# Patient Record
Sex: Female | Born: 1990
Health system: Southern US, Community
[De-identification: ages and names within clinical notes are randomized; demographics above are authoritative.]

## PROBLEM LIST (undated history)

## (undated) ENCOUNTER — Inpatient Hospital Stay (HOSPITAL_COMMUNITY): Payer: Self-pay

## (undated) DIAGNOSIS — F17200 Nicotine dependence, unspecified, uncomplicated: Secondary | ICD-10-CM

## (undated) DIAGNOSIS — O09219 Supervision of pregnancy with history of pre-term labor, unspecified trimester: Secondary | ICD-10-CM

## (undated) DIAGNOSIS — Z8619 Personal history of other infectious and parasitic diseases: Secondary | ICD-10-CM

## (undated) DIAGNOSIS — Z87440 Personal history of urinary (tract) infections: Secondary | ICD-10-CM

## (undated) DIAGNOSIS — Z8669 Personal history of other diseases of the nervous system and sense organs: Secondary | ICD-10-CM

## (undated) DIAGNOSIS — S3141XA Laceration without foreign body of vagina and vulva, initial encounter: Secondary | ICD-10-CM

## (undated) DIAGNOSIS — Z9889 Other specified postprocedural states: Secondary | ICD-10-CM

## (undated) DIAGNOSIS — N760 Acute vaginitis: Secondary | ICD-10-CM

## (undated) DIAGNOSIS — B9689 Other specified bacterial agents as the cause of diseases classified elsewhere: Secondary | ICD-10-CM

## (undated) DIAGNOSIS — F1290 Cannabis use, unspecified, uncomplicated: Secondary | ICD-10-CM

## (undated) DIAGNOSIS — Z22330 Carrier of Group B streptococcus: Secondary | ICD-10-CM

## (undated) DIAGNOSIS — S3739XA Other injury of urethra, initial encounter: Secondary | ICD-10-CM

## (undated) DIAGNOSIS — R87629 Unspecified abnormal cytological findings in specimens from vagina: Secondary | ICD-10-CM

## (undated) DIAGNOSIS — N871 Moderate cervical dysplasia: Secondary | ICD-10-CM

## (undated) DIAGNOSIS — Z8742 Personal history of other diseases of the female genital tract: Secondary | ICD-10-CM

## (undated) DIAGNOSIS — IMO0002 Reserved for concepts with insufficient information to code with codable children: Secondary | ICD-10-CM

## (undated) DIAGNOSIS — O209 Hemorrhage in early pregnancy, unspecified: Secondary | ICD-10-CM

## (undated) HISTORY — PX: OTHER SURGICAL HISTORY: SHX169

## (undated) HISTORY — DX: Personal history of urinary (tract) infections: Z87.440

## (undated) HISTORY — PX: TONSILLECTOMY: SUR1361

## (undated) HISTORY — DX: Moderate cervical dysplasia: N87.1

## (undated) HISTORY — DX: Cannabis use, unspecified, uncomplicated: F12.90

## (undated) HISTORY — DX: Personal history of other infectious and parasitic diseases: Z86.19

## (undated) HISTORY — PX: WISDOM TOOTH EXTRACTION: SHX21

## (undated) HISTORY — DX: Acute vaginitis: N76.0

## (undated) HISTORY — DX: Nicotine dependence, unspecified, uncomplicated: F17.200

## (undated) HISTORY — DX: Reserved for concepts with insufficient information to code with codable children: IMO0002

## (undated) HISTORY — DX: Other specified bacterial agents as the cause of diseases classified elsewhere: B96.89

## (undated) HISTORY — DX: Other injury of urethra, initial encounter: S37.39XA

## (undated) HISTORY — DX: Personal history of other diseases of the female genital tract: Z87.42

## (undated) HISTORY — DX: Personal history of other diseases of the nervous system and sense organs: Z86.69

## (undated) HISTORY — PX: LEEP: SHX91

## (undated) HISTORY — DX: Laceration without foreign body of vagina and vulva, initial encounter: S31.41XA

## (undated) HISTORY — DX: Carrier of group B Streptococcus: Z22.330

## (undated) HISTORY — PX: DILATION AND CURETTAGE OF UTERUS: SHX78

## (undated) HISTORY — DX: Hemorrhage in early pregnancy, unspecified: O20.9

---
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
## 2009-03-14 ENCOUNTER — Inpatient Hospital Stay (HOSPITAL_COMMUNITY): Admission: AD | Admit: 2009-03-14 | Discharge: 2009-03-14 | Payer: Self-pay | Admitting: Obstetrics and Gynecology

## 2009-04-17 ENCOUNTER — Inpatient Hospital Stay (HOSPITAL_COMMUNITY): Admission: AD | Admit: 2009-04-17 | Discharge: 2009-04-17 | Payer: Self-pay | Admitting: Obstetrics and Gynecology

## 2009-04-18 ENCOUNTER — Inpatient Hospital Stay (HOSPITAL_COMMUNITY): Admission: AD | Admit: 2009-04-18 | Discharge: 2009-04-18 | Payer: Self-pay | Admitting: Obstetrics and Gynecology

## 2009-05-20 ENCOUNTER — Inpatient Hospital Stay (HOSPITAL_COMMUNITY): Admission: AD | Admit: 2009-05-20 | Discharge: 2009-05-20 | Payer: Self-pay | Admitting: Obstetrics and Gynecology

## 2009-06-07 ENCOUNTER — Inpatient Hospital Stay (HOSPITAL_COMMUNITY): Admission: AD | Admit: 2009-06-07 | Discharge: 2009-06-10 | Payer: Self-pay | Admitting: Obstetrics and Gynecology

## 2009-06-07 DIAGNOSIS — O209 Hemorrhage in early pregnancy, unspecified: Secondary | ICD-10-CM

## 2009-06-07 HISTORY — DX: Hemorrhage in early pregnancy, unspecified: O20.9

## 2009-07-04 DIAGNOSIS — S3733XA Laceration of urethra, initial encounter: Secondary | ICD-10-CM

## 2009-07-04 DIAGNOSIS — S3141XA Laceration without foreign body of vagina and vulva, initial encounter: Secondary | ICD-10-CM

## 2009-07-04 HISTORY — DX: Laceration of urethra, initial encounter: S37.33XA

## 2009-07-04 HISTORY — DX: Laceration without foreign body of vagina and vulva, initial encounter: S31.41XA

## 2009-12-18 ENCOUNTER — Emergency Department (HOSPITAL_COMMUNITY): Admission: EM | Admit: 2009-12-18 | Discharge: 2009-12-18 | Payer: Self-pay | Admitting: Emergency Medicine

## 2009-12-23 DIAGNOSIS — Z8742 Personal history of other diseases of the female genital tract: Secondary | ICD-10-CM

## 2009-12-23 HISTORY — DX: Personal history of other diseases of the female genital tract: Z87.42

## 2011-03-25 LAB — CBC
HCT: 38.9 % (ref 36.0–46.0)
Hemoglobin: 13.1 g/dL (ref 12.0–15.0)
MCHC: 33.6 g/dL (ref 30.0–36.0)
MCV: 89.4 fL (ref 78.0–100.0)
Platelets: 227 10*3/uL (ref 150–400)
RBC: 4.35 MIL/uL (ref 3.87–5.11)
RDW: 13.2 % (ref 11.5–15.5)
WBC: 9.3 10*3/uL (ref 4.0–10.5)

## 2011-03-25 LAB — DIFFERENTIAL
Basophils Absolute: 0 10*3/uL (ref 0.0–0.1)
Basophils Relative: 1 % (ref 0–1)
Eosinophils Absolute: 0 10*3/uL (ref 0.0–0.7)
Eosinophils Relative: 0 % (ref 0–5)
Lymphocytes Relative: 13 % (ref 12–46)
Lymphs Abs: 1.2 10*3/uL (ref 0.7–4.0)
Monocytes Absolute: 0.4 10*3/uL (ref 0.1–1.0)
Monocytes Relative: 4 % (ref 3–12)
Neutro Abs: 7.6 10*3/uL (ref 1.7–7.7)
Neutrophils Relative %: 82 % — ABNORMAL HIGH (ref 43–77)

## 2011-03-25 LAB — BASIC METABOLIC PANEL
BUN: 6 mg/dL (ref 6–23)
CO2: 22 meq/L (ref 19–32)
Calcium: 9.3 mg/dL (ref 8.4–10.5)
Chloride: 108 mEq/L (ref 96–112)
Creatinine, Ser: 0.66 mg/dL (ref 0.4–1.2)
GFR calc Af Amer: >60 mL/min (ref >60)
GFR calc non Af Amer: >60 mL/min (ref >60)
Glucose, Bld: 92 mg/dL (ref 70–99)
Potassium: 3.6 mEq/L (ref 3.5–5.1)
Sodium: 138 meq/L (ref 135–145)

## 2011-04-01 LAB — CBC
HCT: 31.5 % — ABNORMAL LOW (ref 36.0–46.0)
HCT: 34.7 % — ABNORMAL LOW (ref 36.0–46.0)
Hemoglobin: 11 g/dL — ABNORMAL LOW (ref 12.0–15.0)
Hemoglobin: 12.1 g/dL (ref 12.0–15.0)
MCHC: 33 g/dL (ref 30.0–36.0)
MCHC: 35.1 g/dL (ref 30.0–36.0)
MCV: 94.8 fL (ref 78.0–100.0)
MCV: 96.2 fL (ref 78.0–100.0)
Platelets: 185 10*3/uL (ref 150–400)
Platelets: 242 10*3/uL (ref 150–400)
RBC: 3.27 MIL/uL — ABNORMAL LOW (ref 3.87–5.11)
RBC: 3.66 MIL/uL — ABNORMAL LOW (ref 3.87–5.11)
RDW: 13.3 % (ref 11.5–15.5)
RDW: 13.4 % (ref 11.5–15.5)
WBC: 12.9 10*3/uL — ABNORMAL HIGH (ref 4.0–10.5)
WBC: 15.4 10*3/uL — ABNORMAL HIGH (ref 4.0–10.5)

## 2011-04-01 LAB — RPR: RPR Ser Ql: NONREACTIVE

## 2011-04-04 LAB — URINALYSIS, ROUTINE W REFLEX MICROSCOPIC
Bilirubin Urine: NEGATIVE
Glucose, UA: NEGATIVE mg/dL
Hgb urine dipstick: NEGATIVE
Ketones, ur: 15 mg/dL — AB
Nitrite: NEGATIVE
Protein, ur: NEGATIVE mg/dL
Specific Gravity, Urine: 1.025 (ref 1.005–1.030)
Urobilinogen, UA: 0.2 mg/dL (ref 0.0–1.0)
pH: 6.5 (ref 5.0–8.0)

## 2011-04-04 LAB — GC/CHLAMYDIA PROBE AMP, GENITAL
Chlamydia, DNA Probe: NEGATIVE
GC Probe Amp, Genital: NEGATIVE

## 2011-04-04 LAB — FETAL FIBRONECTIN: Fetal Fibronectin: NEGATIVE

## 2011-04-04 LAB — STREP B DNA PROBE: Strep Group B Ag: POSITIVE

## 2011-04-04 LAB — WET PREP, GENITAL
Clue Cells Wet Prep HPF POC: NONE SEEN
Trich, Wet Prep: NONE SEEN
Yeast Wet Prep HPF POC: NONE SEEN

## 2011-05-07 NOTE — H&P (Signed)
NAMESABRIEL, Wallace               ACCOUNT NO.:  [id number]   MEDICAL RECORD NO.:  [id number]          PATIENT TYPE:  INP   LOCATION:  9165                          FACILITY:  [facility]   PHYSICIAN:  Naima A. Dillard, M.D. DATE OF BIRTH:  [birth year]   DATE OF ADMISSION:  06/07/2009  DATE OF DISCHARGE:                              HISTORY & PHYSICAL   [name] is a 20 year old gravida 1 at 51.6 weeks' gestation who  presents after spontaneous rupture of membranes during her office today  with Dr. Estanislado Pandy today.  During that visit Dr. Estanislado Pandy had a 40-minute  conversation with her regarding her labile mood and depression as well  as her resistance to taking prescribed antidepressants or getting  therapy and counseling for her depression and anxiety.  Ms. Renaldo Reel  pregnancy is remarkable for:  1. History of LEEP in 2009.  2. Smoker.  3. First trimester bleeding.  4. Small right inguinal lymphadenopathy at new OB, currently resolved.  5. Depression, anxiety, panic attacks, palpitations, syncope.  6. Positive GBS.  7. Sciatica.  8. Recurrent UTIs this pregnancy.  9. Low BMI, total weight gain +26 pounds.   PERTINENT LABORATORIES:  [name]'s initial labs included a hemoglobin  of 12.6, hematocrit 36.4, platelets 196,000, blood type A+, antibody  screen negative, RPR nonreactive, rubella titer immune, hepatitis B  negative, HIV nonreactive.  A few months after her new OB she did have a  Pap smear that was negative and normal.  She had a first trimester  screen at 12-1/2 weeks that was normal.  She had a normal AFP.  She had  a 26-week Glucola that was normal at 86 mg/dL.  At that time her  hemoglobin was 10.6 and she had a negative RPR again.  She had a series  of fetal fibronectin tests that were done March 23, negative at 27.5  weeks, April 14 negative at 30.6 weeks and April 26, positive at 32.4  weeks.  Also at 27.5 weeks on March 23 she had a positive group B beta  strep test and  negative chlamydia and gonorrhea cultures.  Later at 88-  1/2 weeks she had a negative GBS test.  She had multiple urine cultures  for recurrent UTI and grew E. coli in her urine cultures.   CURRENT MEDICATIONS:  1. Include [place] vitamins only.  2. She is also on Zofran, last dose 2 days ago.  3. She states she never took the prescribed Lexapro or Zoloft.  4. She has had multiple courses of antibiotics including Macrobid,      Keflex, Septra. amoxicillin.  5. She also had two doses of betamethasone in April.   ALLERGIES:  She does not have any medication allergies, food allergies  or latex allergies.   HISTORY OF PRESENT PREGNANCY:  [name] entered prenatal care very early  with her interview at 9 weeks.  She did have significant nausea,  vomiting and anxiety at that time.  She was prescribed some Zofran.  She  was offered referral for counseling and declined.  She had a couple  visits to  the hospital and had at a 7 week ultrasound an anterior previa  was noted which later resolved.  Her right inguinal lymphadenopathy also  resolved.  She was put on some Keflex for that and then she was changed  to Septra. she began to have anxiety issues and episodes of tachycardia.  She grew E. coli in her urine culture and was put on some Macrobid for a  week at around 12 weeks.  At 12-1/2 weeks she had a first trimester  screen which was normal and began complaining of constipation and was  advised to begin MiraLax.  At that time it was noted that the previa had  resolved and the placenta was now anterior.  She did complete her Septra  course but had some ongoing symptoms of UTI.  She was recultured and E.  coli grew again.  She was put on liquid amoxicillin for 14 weeks.  At 16  weeks she had more panic attacks and an episode of syncope and she was  counseled regarding stress reduction and improved nutrition and  hydration.  Orthostatic blood pressures and pulses were done and were   normal.  She was also counseled on her smoking.  At 18 weeks she was  treated for a yeast infection with Diflucan.  At 19 weeks she had an  anatomy ultrasound which showed size concordant with dates, an anterior  placenta with a three-vessel cord, a normal AFI, cervix 3.18 cm long.  All of fetal anatomy was well visualized and found to be normal.  At 20  weeks she began to have back pain.  At 23 weeks she had a Pap and was  having insomnia and anxiety Dr. Estanislado Pandy prescribed some Zoloft.  The  patient states she never took it.  At 26 weeks she had her 1-hour  Glucola challenge which was normal at 86 mg/dL.  She had a hemoglobin of  10.6 at that time and a negative RPR and Dr. Pennie Rushing prescribed Lexapro.  The patient states she did not take it.  At 27.5 weeks she had a third  visit to the [facility]U for some cramping and mucus and that was the visit  where she had the  positive GBS culture and the first negative fetal  fibronectin.  She was given terbutaline for irritability of her uterus  at that time.  Her cervix was fingertip, 50% and -2 at the next visit at  28 weeks and the patient said she was using herbal  treatments for her  anxiety and panic attacks.  Two weeks later she had a followup fetal  fibronectin which was negative at 30 weeks and 6 days.  At 32 weeks and  6 days she was given another course of antibiotics for UTI and was  complaining about sciatica down her left leg.  She was also given some  more Zofran.  At 34 weeks and 4 days she had a positive fetal  fibronectin test.  At 37 weeks she was seen for decreased fetal movement  and was found to have a reactive NST.  At 39 weeks she was given some  ProctoFoam for hemorrhoid pain and today she was seen by Dr. Estanislado Pandy with  increasing depression and anxiety.  She had a spontaneous rupture of  membranes in the office and was sent over to the hospital.  However it  took her about 3 hours to get over here.  Dr. Estanislado Pandy did state that in   the 40 minute conversation she  had with the patient and her boyfriend  that she convinced her that she really needed to start on some  antidepression anxiety meds following delivery. The plan is for her to  have Zoloft.   MEDICAL HISTORY:  [name] began menstruation at 20 years of age with  cycles occurring every 28 days and lasting for 5 days.  Her  contraception use includes Yaz which she discontinued last fall.  She  does have a history of abnormal Pap and had a LEEP procedure in May of  2009.  She had the usual childhood vaccinations.  She states she did  have the chickenpox as a child.  Prior to the pregnancy she had only had  a one bladder infection but she has been treated three times during the  pregnancy.  She is a smoker since age 27.  She does have anxiety,  depression and panic attacks.  The patient did suffer a fractured elbow  at age 8.   SURGICAL HISTORY:  Only surgical history is the LEEP procedure in May of  2009.   FAMILY MEDICAL HISTORY:  She has a brother who is an insulin-dependent  diabetic.  She has a maternal aunt with migraines.  She has a maternal  grandfather with lung cancer.   GENETIC HISTORY:  The patient had a normal first trimester screen and  AFP.  Genetic screen for the patient and father of baby is  noncontributory except the father of the baby's sister and niece have  webbed feet.   SOCIAL HISTORY:  [name] is a single Caucasian teenager with a high  school degree.  No occupation is listed.  Boyfriend is stated Ulice Dash.  Also has a high school degree, works as a Production designer, theatre/television/film, also  Caucasian.  Stated religion is [place].  The patient does admit to use  of cigarettes throughout the pregnancy but denies use of alcohol or  street drugs.  There is a history of marijuana use 2 years ago.   PHYSICAL EXAM:  [name] is noticeably tense and tearful and repeatedly  states that she is certain she is going to pass out.  VITAL SIGNS:  110/72, pulse  99, respiratory rate 18.  Her axillary  temperature was 100.0 but she had just come in from outside where it is  nearly 90.  LUNGS:  Clear to auscultation bilaterally.  HEART:  Regular although slightly elevated rate without murmur.  She has  no edema of her upper extremities.  ABDOMEN:  Soft and gravid and appropriate for gestational age.  She has trace edema of bilateral lower extremities.  Normal DTRs,  negative Homans' sign x2.  CERVICAL EXAM:  Per Dr. Estanislado Pandy in the office was 4 cm, 90% effaced and  minus one.  Fetal heart rate is 130 baseline variability present,  reactive with multiple accelerations meeting the 15 x 15 criteria.  The  contraction pattern is very irregular with contractions occurring 1-6  minutes with inconsistent strength and duration.   IMPRESSION:  5. 20 year old gravida 1 at 39.6 weeks.  2. Spontaneous rupture of membranes.  3. Reassuring fetal heart rate.  4. Irregular contractions.  5. Positive GBS.  6. Increased anxiety and depression.   PLAN:  1. Admit to medical doctor services.  2. Begin penicillin per protocol.  3. Pitocin p.r.n. no progress in contraction pattern.      Janna Melsness, CNM      Naima A. Normand Sloop, M.D.  Electronically Signed    JM/MEDQ  D:  06/07/2009  T:  06/07/2009  Job:  [id number]

## 2011-05-07 NOTE — Op Note (Signed)
Tricia Wallace, Tricia Wallace               ACCOUNT NO.:  [id number]   MEDICAL RECORD NO.:  [id number]          PATIENT TYPE:  AMB   LOCATION:  SDC                           FACILITY:  [facility]   PHYSICIAN:  Dois Davenport A. Rivard, M.D. DATE OF BIRTH:  Nov 29, 1991   DATE OF PROCEDURE:  DATE OF DISCHARGE:                               OPERATIVE REPORT   PREOPERATIVE DIAGNOSIS:  Moderate cervical dysplasia or cervical  intraepithelial neoplasia II.   POSTOPERATIVE DIAGNOSIS:  Moderate cervical dysplasia or cervical  intraepithelial neoplasia II.   ANESTHESIA:  IV sedation, Raul Del, MD   PROCEDURE:  Loop electrical excision procedure or loop electrocautery  excision procedure.   SURGEON:  Esmeralda Arthur, MD   ASSISTANT:  None.   ESTIMATED BLOOD LOSS:  Minimal.   PROCEDURE:  After being informed of the planned procedure in presence of  her mother, as well as possible complications including bleeding,  infection, failure of treatment, cervical stenosis or cervical  incompetence, informed consent is obtained.  The patient is taken to OR  #3, given IV sedation, and placed in the lithotomy position.  A speculum  is inserted and her cervix was prepped with acetic acid.  Colposcopy  reveals the previously described lesion anteriorly with acetowhite  epithelium and posteriorly with acetowhite and sharp borders.  We  proceed with a paracervical block using 15 mL of lidocaine 1%,  epinephrine 1:200,000, and using a small loop due to the small size of  the cervix.  We do a LEEP in 2 passes removing the anterior lip and the  posterior lip of the cervix, so as to remain as shallow as possible.  The T zone is all included and part of the endocervical canal is  included due to a positive ECC on this patient.  At the end of the  procedure, the excision area is cauterized and Monsel was applied.  Hemostasis is adequate.  Instruments were removed.  Instruments and  sponge count is complete x2.  Estimated  blood loss is minimal.  The  procedure is return to the recovery room in a well and stable condition,  and discharged home.   SPECIMEN:  Anterior lip and posterior lip of the cervix sent to  pathology.      Crist Fat Rivard, M.D.  Electronically Signed     SAR/MEDQ  D:  05/13/2008  T:  05/14/2008  Job:  [id number]

## 2011-09-18 LAB — CBC
HCT: 39.5
Hemoglobin: 13.8
MCHC: 34.9
MCV: 89.6
Platelets: 223
RBC: 4.41
RDW: 12.8
WBC: 5.9

## 2011-09-18 LAB — PREGNANCY, URINE: Preg Test, Ur: NEGATIVE

## 2011-09-23 LAB — TYPE AND SCREEN
ABO/RH(D): A POS
Antibody Screen: NEGATIVE

## 2011-09-23 LAB — ABO/RH: ABO/RH(D): A POS

## 2011-10-07 LAB — URINE MICROSCOPIC-ADD ON: WBC, UA: NONE SEEN

## 2011-10-07 LAB — GC/CHLAMYDIA PROBE AMP, GENITAL
Chlamydia, DNA Probe: NEGATIVE
GC Probe Amp, Genital: NEGATIVE

## 2011-10-07 LAB — POCT PREGNANCY, URINE
Operator id: 223331
Operator id: 223331
Preg Test, Ur: NEGATIVE
Preg Test, Ur: NEGATIVE

## 2011-10-07 LAB — WET PREP, GENITAL
Clue Cells Wet Prep HPF POC: NONE SEEN
Trich, Wet Prep: NONE SEEN
Yeast Wet Prep HPF POC: NONE SEEN

## 2011-10-07 LAB — URINALYSIS, ROUTINE W REFLEX MICROSCOPIC
Bilirubin Urine: NEGATIVE
Glucose, UA: NEGATIVE
Ketones, ur: NEGATIVE
Leukocytes, UA: NEGATIVE
Nitrite: NEGATIVE
Protein, ur: NEGATIVE
Specific Gravity, Urine: 1.015
Urobilinogen, UA: 1
pH: 6.5

## 2012-07-16 ENCOUNTER — Encounter (HOSPITAL_COMMUNITY): Payer: Self-pay | Admitting: *Deleted

## 2012-07-16 ENCOUNTER — Inpatient Hospital Stay (HOSPITAL_COMMUNITY)
Admission: AD | Admit: 2012-07-16 | Discharge: 2012-07-16 | Disposition: A | Discharge status: Home / Self Care | Payer: Self-pay | Source: Ambulatory Visit | Attending: Obstetrics and Gynecology | Admitting: Obstetrics and Gynecology

## 2012-07-16 ENCOUNTER — Inpatient Hospital Stay (HOSPITAL_COMMUNITY): Payer: Self-pay

## 2012-07-16 DIAGNOSIS — R109 Unspecified abdominal pain: Secondary | ICD-10-CM | POA: Insufficient documentation

## 2012-07-16 DIAGNOSIS — Z331 Pregnant state, incidental: Secondary | ICD-10-CM

## 2012-07-16 HISTORY — DX: Other specified postprocedural states: Z98.890

## 2012-07-16 LAB — URINALYSIS, ROUTINE W REFLEX MICROSCOPIC
Bilirubin Urine: NEGATIVE
Glucose, UA: NEGATIVE mg/dL
Hgb urine dipstick: NEGATIVE
Ketones, ur: NEGATIVE mg/dL
Leukocytes, UA: NEGATIVE
Nitrite: NEGATIVE
Protein, ur: NEGATIVE mg/dL
Specific Gravity, Urine: <1.005 — ABNORMAL LOW (ref 1.005–1.030)
Urobilinogen, UA: 0.2 mg/dL (ref 0.0–1.0)
pH: 6.5 (ref 5.0–8.0)

## 2012-07-16 LAB — POCT PREGNANCY, URINE: Preg Test, Ur: POSITIVE — AB

## 2012-07-16 LAB — WET PREP, GENITAL
Clue Cells Wet Prep HPF POC: NONE SEEN
Trich, Wet Prep: NONE SEEN

## 2012-07-16 LAB — HCG, QUANTITATIVE, PREGNANCY: hCG, Beta Chain, Quant, S: 612 m[IU]/mL — ABNORMAL HIGH (ref <5)

## 2012-07-16 NOTE — MAU Note (Signed)
Patient states she has had 4 positive home pregnancy tests yesterday. Had been cramping for several days. No bleeding. Last Depo Provera was in September 2012.

## 2012-07-16 NOTE — Discharge Instructions (Signed)
Miscarriage (Spontaneous Miscarriage) A miscarriage is when you lose your baby before the twentieth week of pregnancy. Miscarriages happen in 15-20% of pregnancies. Most miscarriages happen in the first 13 weeks of the pregnancy. In medical terms, this is called a spontaneous miscarriage or early pregnancy loss. No further treatment is needed when the miscarriage is complete and all products of conception have been passed out of the body. You can begin trying for another pregnancy as soon as your caregiver says it is okay. CAUSES   Most causes are not known.   Genetic problems like abnormal, not enough or too many chromosomes.   Infection of the cervix or uterus.   An abnormal shaped uterus, fibroid tumors or congenital abnormalities.   Hormone problems.   Medical problems.   Incompetent cervix, the tissue in the cervix is not strong enough to hold the pregnancy.   Smoking, too much alcohol use and illegal drugs.   Trauma.  SYMPTOMS   Bleeding or spotting from the vagina.   Cramping of the lower abdomen.   Passing of fluid from the vagina with or without cramps or pain.   Passing fetal tissue.  TREATMENT   Sometimes no further treatment is necessary if you pass all the tissue in the uterus.   If partial parts of the fetus or placenta remain in the body (incomplete miscarriage), tissue left behind may become infected. Usually a D and C (Dilatation and Curettage) suction or scrapping of the uterus is necessary to remove the remaining tissue in uterus. The procedure is only done when your caregiver knows that there is no chance for the pregnancy to continue. This is determined by a physical exam, a negative pregnancy test, blood tests and perhaps an ultrasound revealing a dead fetus or no fetus developing because a problem occurred at conception (when the sperm and egg unite).   Medications may be necessary, antibiotics if there is an infection or medications to contract the uterus  if there is a lot of bleeding.   If you have Rh negative blood and your partner is Rh positive, you will need a Rho-gam shot (an immune globulin vaccine). This will protect your baby from having Rh blood problems in future pregnancies.  HOME CARE INSTRUCTIONS   Your caregiver may order bed rest (up to the bathroom only). He or she may allow you to continue light activity. You may need to make arrangements for the care of children and for any other responsibilities.   Keep track of the number of pads you use each day and how soaked (saturated) they are. Record this information.   Do not use tampons. Do not douche or have sexual intercourse until approved by your caregiver.   Only take over-the-counter or prescription medicines for pain, discomfort or fever as directed by your caregiver.   Do not take aspirin because it can cause bleeding.   It is very important to keep all follow-up appointments for re-evaluations and continuing management.   Tell your caregiver if you are experiencing domestic violence.   Women who have an Rh negative blood type (i.e., A, B, AB, or O negative) need to receive a drug called Rh(D) immune globulin (RhoGam). This medicine helps protect future fetuses against problems that can occur if an Rh negative mother is carrying a baby who is Rh positive.   If you and/or your partner are having problems with guilt or grieving, talk to your caregiver or seek counseling to help you cope with the pregnancy loss.   Allow enough time to grieve before trying to get pregnant again.  SEEK IMMEDIATE MEDICAL CARE IF:   You have severe cramps or pain in your stomach, back, or belly (abdomen).   You have a fever.   You pass large clots or tissue. Save any tissue for your caregiver to inspect.   Your bleeding increases.   You become light-headed, weak or have fainting episodes.   You develop chills.  Document Released: 06/04/2001 Document Revised: 11/28/2011 Document Reviewed:  07/11/2008 Exit[employer] Patient Information 2012 [employer] .Ectopic Pregnancy An ectopic pregnancy is when the fertilized egg attaches (implants) outside the uterus. Most ectopic pregnancies occur in the fallopian tube. Rarely do ectopic pregnancies occur on the ovary, intestine, pelvis, or cervix. An ectopic pregnancy does not have the ability to develop into a normal, healthy baby.  A ruptured ectopic pregnancy is one in which the fallopian tube gets torn or bursts and results in internal bleeding. Often there is intense abdominal pain, and sometimes, vaginal bleeding. Having an ectopic pregnancy can be a life-threatening experience. If left untreated, this dangerous condition can lead to a blood transfusion, abdominal surgery, or even death. CAUSES  Damage to the fallopian tubes is the suspected cause in most ectopic pregnancies.  RISK FACTORS Depending on your circumstances, the amount of risk of having an ectopic pregnancy will vary. There are 3 categories that may help you identify whether you are potentially at risk. High Risk  You have gone through infertility treatment.   You have had a previous ectopic pregnancy.   You have had previous tubal surgery.   You have had previous surgery to have the fallopian tubes tied (tubal ligation).   You have tubal problems or diseases.   You have been exposed to DES. DES is a medicine that was used until 1971 and had effects on babies whose mothers took the medicine.   You become pregnant while using an intrauterine device (IUD) for birth control.  Moderate Risk  You have a history of infertility.   You have a history of a sexually transmitted infection (STI).   You have a history of pelvic inflammatory disease (PID).   You have scarring from endometriosis.   You have multiple sexual partners.   You smoke.  Low Risk  You have had previous pelvic surgery.   You use vaginal douching.   You became sexually active before 22  years of age.  SYMPTOMS  An ectopic pregnancy should be suspected in anyone who has missed a period and has abdominal pain or bleeding.  You may experience normal pregnancy symptoms, such as:   Nausea.   Tiredness.   Breast tenderness.   Symptoms that are not normal include:   Pain with intercourse.   Irregular vaginal bleeding or spotting.   Cramping or pain on one side, or in the lower abdomen.   Fast heartbeat.   Passing out while having a bowel movement.   Symptoms of a ruptured ectopic pregnancy and internal bleeding may include:   Sudden, severe pain in the abdomen and pelvis.   Dizziness or fainting.   Pain in the shoulder area.  DIAGNOSIS  Tests that may be performed include:  A pregnancy test.   An ultrasound.   Testing the specific level of pregnancy hormone in the bloodstream.   Taking a sample of uterus tissue (dilation and curettage, D&C).   Surgery to perform a visual exam of the inside of the abdomen using a lighted tube (laparoscopy).  TREATMENT    An injection of methotrexate medicine may be given. This is given if:  The diagnosis is made early.   The fallopian tube has not ruptured.   You are considered to be a good candidate for the medicine.  Usually, pregnancy hormone blood levels are checked after methotrexate treatment. This is to be sure the medicine is effective. It may take 4 to 6 weeks for the pregnancy to be absorbed (though most pregnancies will be absorbed by 3 weeks). Surgical treatment may be needed. A lighted tube (laparoscope) is used to remove the tubal pregnancy. If severe internal bleeding occurs, a cut (incision) may be made in the lower abdomen (laparotomy), and the ectopic pregnancy is removed. This stops the bleeding. Part of the fallopian tube, or the whole tube, may be removed as well (salpingectomy). After surgery, pregnancy hormone tests may be done to be sure there is no pregnancy tissue left. You may receive a RhoGAM  shot if you are Rh negative and the father is Rh positive, or if you do not know the Rh type of the father. This is to prevent problems with any future pregnancy. SEEK IMMEDIATE MEDICAL CARE IF:  You have any symptoms of an ectopic pregnancy. This is a medical emergency. Document Released: 01/16/2005 Document Revised: 11/28/2011 Document Reviewed: 08/15/2011 Exit[employer] Patient Information 2012 [employer]. 

## 2012-07-16 NOTE — Progress Notes (Addendum)
History     Chief Complaint  Patient presents with  . Possible Pregnancy  . Abdominal Pain   @SFHPI @  OB History    Grav Para Term Preterm Abortions TAB SAB Ect Mult Living   2 1 1       1       Past Medical History  Diagnosis Date  . No pertinent past medical history     Past Surgical History  Procedure Date  . Wisdom tooth extraction   . Dilation and curettage of uterus     History reviewed. No pertinent family history.  History  Substance Use Topics  . Smoking status: Current Everyday Smoker -- 1.0 packs/day    Types: Cigarettes  . Smokeless tobacco: Not on file  . Alcohol Use: No    Allergies: No Known Allergies  No prescriptions prior to admission    @ROS @ Physical Exam   Blood pressure 107/70, pulse 83, temperature 98.5 F (36.9 C), temperature source Oral, resp. rate 16, height 5\' 5"  (1.651 m), weight 119 lb 3.2 oz (54.069 kg), last menstrual period 06/13/2012, SpO2 100.00%. History     Chief Complaint  Patient presents with  . Possible Pregnancy  . Abdominal Pain   @SFHPI @  OB History    Grav Para Term Preterm Abortions TAB SAB Ect Mult Living   2 1 1       1       Past Medical History  Diagnosis Date  . No pertinent past medical history     Past Surgical History  Procedure Date  . Wisdom tooth extraction   . Dilation and curettage of uterus     History reviewed. No pertinent family history.  History  Substance Use Topics  . Smoking status: Current Everyday Smoker -- 1.0 packs/day    Types: Cigarettes  . Smokeless tobacco: Not on file  . Alcohol Use: No    Allergies: No Known Allergies  No prescriptions prior to admission    @ROS @ Physical Exam   Blood pressure 107/70, pulse 83, temperature 98.5 F (36.9 C), temperature source Oral, resp. rate 16, height 5\' 5"  (1.651 m), weight 119 lb 3.2 oz (54.069 kg), last menstrual period 06/13/2012, SpO2 100.00%.  @PHYSEXAMBYAGE2 @  ED Course  Presents with c/o of "crampy abd  x 3-4 days, no BC since Sept depo provera, had a normal period 6/22 lasting 4 days, horrible odor when I pee" abdomen nontender, Normal hair distrubition mons pubis,  EGBUS WNL, sterile speculum exam,  vagina pink, moist normal rugae,  cerix LTC, with cervical motion tenderness, No adnexal masses, with L sided tenderness with pelvic exam White scant discharge WET PREP, GC/CHL Pelvic US pending. A [redacted]w[redacted]d P [place], labs pending report to S. Hetty Linhart, CNM  Lavera Guise, CNM   Addendum: Per S.Shanasia Ibrahim, CNM [place] results: Intrauterine gestational sac: A possible gestational sac is seen  centrally within the superior fundus  Yolk sac: Not visualized  Embryo: Not visualized  Cardiac Activity: Not visualized  MSD: 1.3 mm  Maternal uterus/adnexae:  Normal  QHCG = 612  C/W Dr Su Hilt  Pt to come back to [facility]U in 48hrs (Saturday) for repeat quant miscarriage and ectopic precautions  F/u US 1wk - based on quant, or earlier if pain increases or VB  Pt dc'd home in stable condition preg verification given

## 2012-07-17 ENCOUNTER — Other Ambulatory Visit: Payer: Self-pay | Admitting: Obstetrics and Gynecology

## 2012-07-17 LAB — GC/CHLAMYDIA PROBE AMP, GENITAL
Chlamydia, DNA Probe: NEGATIVE
GC Probe Amp, Genital: NEGATIVE

## 2012-07-18 ENCOUNTER — Inpatient Hospital Stay (HOSPITAL_COMMUNITY)
Admission: AD | Admit: 2012-07-18 | Discharge: 2012-07-18 | Disposition: A | Discharge status: Home / Self Care | Payer: Self-pay | Source: Ambulatory Visit | Attending: Obstetrics and Gynecology | Admitting: Obstetrics and Gynecology

## 2012-07-18 DIAGNOSIS — O99891 Other specified diseases and conditions complicating pregnancy: Secondary | ICD-10-CM | POA: Insufficient documentation

## 2012-07-18 LAB — HCG, QUANTITATIVE, PREGNANCY: hCG, Beta Chain, Quant, S: 1799 m[IU]/mL — ABNORMAL HIGH (ref <5)

## 2012-07-18 NOTE — MAU Note (Signed)
Tricia Wallace CNM notified of BHCG level 1799. Plan, may discharge pt to home

## 2012-07-18 NOTE — MAU Note (Signed)
Here for BHCG, no vaginal bleeding, no pain

## 2012-07-18 NOTE — MAU Provider Note (Signed)
  History     CSN: [id number]  Arrival date and time: 07/18/12 1837   None     Chief Complaint  Patient presents with  . Labs Only   HPI Pt presents for repeat quant hCg only.  Per RN, denies bleeding, cramping or abd pain.  Prev eval in MAU on 07/16/12  OB History    Grav Para Term Preterm Abortions TAB SAB Ect Mult Living   2 1 1       1       Past Medical History  Diagnosis Date  . No pertinent past medical history   . S/P LEEP     Past Surgical History  Procedure Date  . Wisdom tooth extraction   . Dilation and curettage of uterus     No family history on file.  History  Substance Use Topics  . Smoking status: Current Everyday Smoker -- 1.0 packs/day    Types: Cigarettes  . Smokeless tobacco: Not on file  . Alcohol Use: No    Allergies: No Known Allergies  No prescriptions prior to admission    ROS Physical Exam   Blood pressure 102/85, pulse 84, temperature 98.7 F (37.1 C), temperature source Oral, resp. rate 16, last menstrual period 06/13/2012.  Physical Exam Deferred  MAU Course  Procedures Results for orders placed during the hospital encounter of 07/18/12 (from the past 48 hour(s))  HCG, QUANTITATIVE, PREGNANCY     Status: Abnormal   Collection Time   07/18/12  6:51 PM      Component Value Range Comment   hCG, Beta Chain, Quant, S 1799 (*) <5 mIU/mL    07/16/12-quant 612    Assessment and Plan  Normally rising quant hCG  LM on pt VM to f/u as prev advised by Sanda Klein, CNM at [facility] this coming Thursday for [place] to confirm IUP.  Msg left with office for pt to be scheduled.   Revd ectopic/SAB s/s.  Tavien Chestnut O. 07/18/2012, 9:13 PM

## 2012-07-20 ENCOUNTER — Telehealth: Payer: Self-pay | Admitting: Obstetrics and Gynecology

## 2012-07-20 NOTE — Telephone Encounter (Signed)
Pt called, states was seen @ MAU by SL for pregnancy.  Had [facility] done, and rptd on Sat when pt saw NS.  Per NS note pt needed rpt [place] in the office, was aware that pt had no insurance but still wanted her to have an appt.  Per JF, pt can set up a payment plan until her insurance comes through.  Pt sched for viability [place] on Tuesday 07/28/12 @ 1530, f/u w/ EP @ 1600.  Transferred call to TF to discuss payment arrangements.

## 2012-07-20 NOTE — Telephone Encounter (Signed)
Triage/chart received

## 2012-07-27 ENCOUNTER — Other Ambulatory Visit: Payer: Self-pay

## 2012-07-27 DIAGNOSIS — O26849 Uterine size-date discrepancy, unspecified trimester: Secondary | ICD-10-CM

## 2012-07-27 DIAGNOSIS — N912 Amenorrhea, unspecified: Secondary | ICD-10-CM

## 2012-07-28 ENCOUNTER — Ambulatory Visit (INDEPENDENT_AMBULATORY_CARE_PROVIDER_SITE_OTHER): Payer: Self-pay

## 2012-07-28 ENCOUNTER — Other Ambulatory Visit: Payer: Self-pay | Admitting: Obstetrics and Gynecology

## 2012-07-28 ENCOUNTER — Encounter: Payer: Self-pay | Admitting: Obstetrics and Gynecology

## 2012-07-28 ENCOUNTER — Ambulatory Visit (INDEPENDENT_AMBULATORY_CARE_PROVIDER_SITE_OTHER): Payer: Self-pay | Admitting: Obstetrics and Gynecology

## 2012-07-28 VITALS — BP 90/62 | HR 68 | Wt 120.0 lb

## 2012-07-28 DIAGNOSIS — O3680X0 Pregnancy with inconclusive fetal viability, not applicable or unspecified: Secondary | ICD-10-CM

## 2012-07-28 DIAGNOSIS — O26849 Uterine size-date discrepancy, unspecified trimester: Secondary | ICD-10-CM

## 2012-07-28 DIAGNOSIS — O30009 Twin pregnancy, unspecified number of placenta and unspecified number of amniotic sacs, unspecified trimester: Secondary | ICD-10-CM

## 2012-07-28 LAB — US OB TRANSVAGINAL

## 2012-07-28 MED ORDER — ONDANSETRON HCL 8 MG PO TABS: 8.0000 mg | ORAL_TABLET | Freq: Three times a day (TID) | ORAL | Status: AC | PRN

## 2012-07-28 MED ORDER — ONDANSETRON HCL 8 MG PO TABS: 8.0000 mg | ORAL_TABLET | Freq: Three times a day (TID) | ORAL | Status: DC | PRN

## 2012-07-28 NOTE — Progress Notes (Signed)
21 YO patient seen at MAU at the end of July for pelvic pain and early pregnancy.  Presents today for a viability U/S.  GC and CT cultures done at that time were negative.  Patient denies any further pelvic pain but omplaining of tremendous nausea. States that in her previous pregnancy she spent most of her time vomiting and would like to have some Zofran on hand should her symptoms become unbearable.  O:  U/S-twin gestation:  A-5w 5d  with FHR= 157 bpm      and      B- 5w 5d with FHR=140 bpm      (DI/DI)  A: Twin Gestation: [redacted]w[redacted]d      Severe nausea      S/P LEEP Procedure (CIN-II)  P: Continue PNV       Zofran 8 mg #20 1 po q 8 hours prn-nausea  1 refill      Schedule NOB visit  Rennee Coyne, PA-C

## 2012-08-05 ENCOUNTER — Telehealth: Payer: Self-pay | Admitting: Obstetrics and Gynecology

## 2012-08-05 NOTE — Telephone Encounter (Signed)
Spoke with pt rgd msg pt states rx for zofran 200 dollars but can get zofran 8mg  disp number 10 at [facility] for 10 dollars wants to know if rx can be changed and sent to [facility] [place] advised pt will consult with EP and call her back pt voice understanding

## 2012-08-05 NOTE — Telephone Encounter (Signed)
TRIAGE/RX QUEST. °

## 2012-08-06 MED ORDER — ONDANSETRON 8 MG PO TBDP: 8.0000 mg | ORAL_TABLET | Freq: Three times a day (TID) | ORAL | Status: AC | PRN

## 2012-08-06 NOTE — Telephone Encounter (Signed)
Patient requesting Zofran 8 mg # 10 1 po q 8 hours (in lieu of previous Zofran prescription) due to cost.  Patient may have this prescription with 2 refills.  Nevae Pinnix, PA-C

## 2012-08-06 NOTE — Telephone Encounter (Signed)
Spoke with pt informed rx sent to pharm pt voice understanding 

## 2012-08-12 ENCOUNTER — Encounter: Payer: Self-pay | Admitting: Obstetrics and Gynecology

## 2012-08-12 ENCOUNTER — Telehealth: Payer: Self-pay | Admitting: Obstetrics and Gynecology

## 2012-08-12 NOTE — Telephone Encounter (Signed)
Lm for pt to cb. Calling to confirm mailing address & let her know letter requested was faxed to Ms. Williams today.

## 2012-08-28 ENCOUNTER — Other Ambulatory Visit: Payer: Self-pay

## 2012-08-28 ENCOUNTER — Ambulatory Visit (INDEPENDENT_AMBULATORY_CARE_PROVIDER_SITE_OTHER): Payer: Medicaid Other

## 2012-08-28 DIAGNOSIS — N949 Unspecified condition associated with female genital organs and menstrual cycle: Secondary | ICD-10-CM

## 2012-08-28 DIAGNOSIS — O26849 Uterine size-date discrepancy, unspecified trimester: Secondary | ICD-10-CM

## 2012-08-28 DIAGNOSIS — O26899 Other specified pregnancy related conditions, unspecified trimester: Secondary | ICD-10-CM

## 2012-08-28 DIAGNOSIS — O30009 Twin pregnancy, unspecified number of placenta and unspecified number of amniotic sacs, unspecified trimester: Secondary | ICD-10-CM

## 2012-08-28 DIAGNOSIS — Z331 Pregnant state, incidental: Secondary | ICD-10-CM

## 2012-08-28 DIAGNOSIS — R102 Pelvic and perineal pain: Secondary | ICD-10-CM

## 2012-08-28 DIAGNOSIS — Z36 Encounter for antenatal screening of mother: Secondary | ICD-10-CM

## 2012-08-28 LAB — POCT URINALYSIS DIPSTICK
Bilirubin, UA: NEGATIVE
Glucose, UA: NEGATIVE
Nitrite, UA: NEGATIVE
Spec Grav, UA: 1.02
Urobilinogen, UA: NEGATIVE
pH, UA: 5

## 2012-08-28 LAB — US OB COMP ADDL GEST LESS 14 WKS

## 2012-08-28 LAB — US OB COMP LESS 14 WKS

## 2012-08-29 LAB — PRENATAL PANEL VII
Antibody Screen: NEGATIVE
Basophils Absolute: 0 10*3/uL (ref 0.0–0.1)
Basophils Relative: 0 % (ref 0–1)
Eosinophils Absolute: 0.1 10*3/uL (ref 0.0–0.7)
Eosinophils Relative: 1 % (ref 0–5)
HCT: 32.7 % — ABNORMAL LOW (ref 36.0–46.0)
HIV: NONREACTIVE
Hemoglobin: 11.7 g/dL — ABNORMAL LOW (ref 12.0–15.0)
Hepatitis B Surface Ag: NEGATIVE
Lymphocytes Relative: 26 % (ref 12–46)
Lymphs Abs: 1.8 10*3/uL (ref 0.7–4.0)
MCH: 30.8 pg (ref 26.0–34.0)
MCHC: 35.8 g/dL (ref 30.0–36.0)
MCV: 86.1 fL (ref 78.0–100.0)
Monocytes Absolute: 0.4 10*3/uL (ref 0.1–1.0)
Monocytes Relative: 6 % (ref 3–12)
Neutro Abs: 4.8 10*3/uL (ref 1.7–7.7)
Neutrophils Relative %: 67 % (ref 43–77)
Platelets: 179 10*3/uL (ref 150–400)
RBC: 3.8 MIL/uL — ABNORMAL LOW (ref 3.87–5.11)
RDW: 13.1 % (ref 11.5–15.5)
Rh Type: POSITIVE
Rubella: 53.8 [IU]/mL — ABNORMAL HIGH
WBC: 7.1 10*3/uL (ref 4.0–10.5)

## 2012-08-30 LAB — CULTURE, OB URINE: Colony Count: 3000

## 2012-09-02 ENCOUNTER — Telehealth: Payer: Self-pay | Admitting: Obstetrics and Gynecology

## 2012-09-02 NOTE — Telephone Encounter (Signed)
Tc to pt regarding msg.  Lm on vm to call back. 

## 2012-09-02 NOTE — Telephone Encounter (Signed)
TRIAGE/TST RES QUES

## 2012-09-02 NOTE — Telephone Encounter (Signed)
Pt returned call. States at last visit was told there was blood in her urine and was awaiting her urine culture results.  Was worried about the blood being in her urine.  Pt denies any urinary sxs.  Pt informed urine culture insufficient growth of bacteria and not enough to treat, pt denies any vaginal bleeding.  Pt advised to push fluids, water and cranberry juice, and call back with any concerns.  Pt voices understanding and is more at ease.

## 2012-09-10 ENCOUNTER — Encounter (HOSPITAL_COMMUNITY): Payer: Self-pay | Admitting: Obstetrics and Gynecology

## 2012-09-10 ENCOUNTER — Encounter: Payer: Self-pay | Admitting: Obstetrics and Gynecology

## 2012-09-10 ENCOUNTER — Other Ambulatory Visit: Payer: Self-pay | Admitting: Obstetrics and Gynecology

## 2012-09-10 ENCOUNTER — Inpatient Hospital Stay (HOSPITAL_COMMUNITY)
Admission: AD | Admit: 2012-09-10 | Discharge: 2012-09-10 | Disposition: A | Discharge status: Home / Self Care | Payer: Medicaid Other | Source: Ambulatory Visit | Attending: Obstetrics and Gynecology | Admitting: Obstetrics and Gynecology

## 2012-09-10 ENCOUNTER — Ambulatory Visit (INDEPENDENT_AMBULATORY_CARE_PROVIDER_SITE_OTHER): Payer: Medicaid Other

## 2012-09-10 ENCOUNTER — Ambulatory Visit (INDEPENDENT_AMBULATORY_CARE_PROVIDER_SITE_OTHER): Payer: Medicaid Other | Admitting: Obstetrics and Gynecology

## 2012-09-10 VITALS — BP 102/60 | Wt 109.0 lb

## 2012-09-10 DIAGNOSIS — Z36 Encounter for antenatal screening of mother: Secondary | ICD-10-CM

## 2012-09-10 DIAGNOSIS — Z331 Pregnant state, incidental: Secondary | ICD-10-CM

## 2012-09-10 DIAGNOSIS — E860 Dehydration: Secondary | ICD-10-CM | POA: Insufficient documentation

## 2012-09-10 DIAGNOSIS — O30049 Twin pregnancy, dichorionic/diamniotic, unspecified trimester: Secondary | ICD-10-CM

## 2012-09-10 DIAGNOSIS — O4390 Unspecified placental disorder, unspecified trimester: Secondary | ICD-10-CM

## 2012-09-10 DIAGNOSIS — Z9889 Other specified postprocedural states: Secondary | ICD-10-CM

## 2012-09-10 DIAGNOSIS — O99891 Other specified diseases and conditions complicating pregnancy: Secondary | ICD-10-CM | POA: Insufficient documentation

## 2012-09-10 DIAGNOSIS — O3440 Maternal care for other abnormalities of cervix, unspecified trimester: Secondary | ICD-10-CM

## 2012-09-10 DIAGNOSIS — R112 Nausea with vomiting, unspecified: Secondary | ICD-10-CM

## 2012-09-10 DIAGNOSIS — O30009 Twin pregnancy, unspecified number of placenta and unspecified number of amniotic sacs, unspecified trimester: Secondary | ICD-10-CM

## 2012-09-10 LAB — POCT URINALYSIS DIPSTICK
Bilirubin, UA: NEGATIVE
Blood, UA: NEGATIVE
Glucose, UA: NEGATIVE
Leukocytes, UA: NEGATIVE
Nitrite, UA: NEGATIVE
Protein, UA: NEGATIVE
Spec Grav, UA: 1.01
Urobilinogen, UA: NEGATIVE
pH, UA: 5

## 2012-09-10 LAB — URINALYSIS, ROUTINE W REFLEX MICROSCOPIC
Bilirubin Urine: NEGATIVE
Glucose, UA: NEGATIVE mg/dL
Hgb urine dipstick: NEGATIVE
Ketones, ur: 15 mg/dL — AB
Leukocytes, UA: NEGATIVE
Nitrite: NEGATIVE
Protein, ur: 30 mg/dL — AB
Specific Gravity, Urine: 1.015 (ref 1.005–1.030)
Urobilinogen, UA: 4 mg/dL — ABNORMAL HIGH (ref 0.0–1.0)
pH: 8.5 — ABNORMAL HIGH (ref 5.0–8.0)

## 2012-09-10 LAB — US OB COMP ADDL GEST LESS 14 WKS

## 2012-09-10 LAB — URINE MICROSCOPIC-ADD ON

## 2012-09-10 LAB — US OB COMP LESS 14 WKS

## 2012-09-10 MED ORDER — ONDANSETRON 8 MG PO TBDP: 8.0000 mg | ORAL_TABLET | Freq: Three times a day (TID) | ORAL | Status: DC | PRN

## 2012-09-10 MED ORDER — ONDANSETRON HCL 4 MG/2ML IJ SOLN: 4.0000 mg | Freq: Once | INTRAMUSCULAR | Status: DC

## 2012-09-10 MED ORDER — LACTATED RINGERS IV BOLUS (SEPSIS): 1000.0000 mL | Freq: Once | INTRAVENOUS | Status: DC

## 2012-09-10 NOTE — Progress Notes (Signed)
[redacted]w[redacted]d Pt unable to void.  Pt c/o nausea

## 2012-09-10 NOTE — MAU Note (Signed)
Pt says she feels great and has been eating better in the last few days. Pt says she was surprised she was dehydrated because she feels so good.

## 2012-09-10 NOTE — Discharge Instructions (Signed)
Hyperemesis Gravidarum  Hyperemesis gravidarum is a severe form of nausea and vomiting that happens during pregnancy. Hyperemesis is worse than morning sickness. It may cause a woman to have nausea or vomiting all day for many days. It may keep a woman from eating and drinking enough food and liquids. Hyperemesis usually occurs during the first half (the first 20 weeks) of pregnancy. It often goes away once a woman is in her second half of pregnancy. However, sometimes hyperemesis continues through an entire pregnancy.   CAUSES   The cause of this condition is not completely known but is thought to be due to changes in the body's hormones when pregnant. It could be the high level of the pregnancy hormone or an increase in estrogen in the body.   SYMPTOMS    Severe nausea and vomiting.   Nausea that does not go away.   Vomiting that does not allow you to keep any food down.   Weight loss and body fluid loss (dehydration).   Having no desire to eat or not liking food you have previously enjoyed.  DIAGNOSIS   Your caregiver may ask you about your symptoms. Your caregiver may also order blood tests and urine tests to make sure something else is not causing the problem.   TREATMENT   You may only need medicine to control the problem. If medicines do not control the nausea and vomiting, you will be treated in the hospital to prevent dehydration, acidosis, weight loss, and changes in the electrolytes in your body that may harm the unborn baby (fetus). You may need intravenous (IV) fluids.   HOME CARE INSTRUCTIONS    Take all medicine as directed by your caregiver.   Try eating a couple of dry crackers or toast in the morning before getting out of bed.   Avoid foods and smells that upset your stomach.   Avoid fatty and spicy foods. Eat 5 to 6 small meals a day.   Do not drink when eating meals. Drink between meals.   For snacks, eat high protein foods, such as cheese. Eat or suck on things that have ginger in  them. Ginger helps nausea.   Avoid food preparation. The smell of food can spoil your appetite.   Avoid iron pills and iron in your multivitamins until after 3 to 4 months of being pregnant.  SEEK MEDICAL CARE IF:    Your abdominal pain increases since the last time you saw your caregiver.   You have a severe headache.   You develop vision problems.   You feel you are losing weight.  SEEK IMMEDIATE MEDICAL CARE IF:    You are unable to keep fluids down.   You vomit blood.   You have constant nausea and vomiting.   You have a fever.   You have excessive weakness, dizziness, fainting, or extreme thirst.  MAKE SURE YOU:    Understand these instructions.   Will watch your condition.   Will get help right away if you are not doing well or get worse.  Document Released: 12/09/2005 Document Revised: 11/28/2011 Document Reviewed: 03/11/2011  Exit[employer] Patient Information 2012 [employer].

## 2012-09-10 NOTE — MAU Note (Signed)
Pt presents to MAU with chief complaint of dehydration. Pt was sent from the Dr's office and says she was severely dehydrated and they sent her here for fluid.

## 2012-09-10 NOTE — Progress Notes (Signed)
Pt is a 21yo G2P1 at [redacted]w[redacted]d w twins She was seen in the office today and had [place] for 1st trimester screen and NOB w/u She states she was sent from the office for IVF's  S: pt reports mild nausea, no vomiting today, ate several bowls of vegetable soup for dinner Denies any pain, ctx, bleeding, she denies any dysuria  Pt desires to go home, she declines IVF's.   O:  Filed Vitals:   09/10/12 2055  BP: 102/59  Pulse: 77  Temp: 98.8 F (37.1 C)  TempSrc: Oral  Resp: 18    Results for orders placed during the hospital encounter of 09/10/12 (from the past 24 hour(s))  URINALYSIS, ROUTINE W REFLEX MICROSCOPIC     Status: Abnormal   Collection Time   09/10/12  8:38 PM      Component Value Range   Color, Urine YELLOW  YELLOW   APPearance CLEAR  CLEAR   Specific Gravity, Urine 1.015  1.005 - 1.030   pH 8.5 (*) 5.0 - 8.0   Glucose, UA NEGATIVE  NEGATIVE mg/dL   Hgb urine dipstick NEGATIVE  NEGATIVE   Bilirubin Urine NEGATIVE  NEGATIVE   Ketones, ur 15 (*) NEGATIVE mg/dL   Protein, ur 30 (*) NEGATIVE mg/dL   Urobilinogen, UA 4.0 (*) 0.0 - 1.0 mg/dL   Nitrite NEGATIVE  NEGATIVE   Leukocytes, UA NEGATIVE  NEGATIVE  URINE MICROSCOPIC-ADD ON     Status: Abnormal   Collection Time   09/10/12  8:38 PM      Component Value Range   Squamous Epithelial / LPF FEW (*) RARE   WBC, UA 3-6  <3 WBC/hpf   RBC / HPF 3-6  <3 RBC/hpf   Bacteria, UA MANY (*) RARE   Urine-Other AMORPHOUS URATES/PHOSPHATES     Gen: A&O x3 Abd: soft NT, gravid Pelvic - deferred  A: 12wk twin IUP Mild ketonuria and proteinuria  + bacteruria  Urine cx from 9-5 was negative  P: rv'd w pt UA results, and ketonuria likely from persistent nausea/vomiting, even though improving now She declines IVF's at this point She has agreed to continue to monitor and if still has vomiting tomorrow or over the weekend will come in for IVF's rv'd hyperemesis diet and continue on zofran PRN Pt dc'd home in stable condition

## 2012-09-12 ENCOUNTER — Encounter: Payer: Self-pay | Admitting: Obstetrics and Gynecology

## 2012-09-12 DIAGNOSIS — Z9889 Other specified postprocedural states: Secondary | ICD-10-CM | POA: Insufficient documentation

## 2012-09-12 DIAGNOSIS — O3440 Maternal care for other abnormalities of cervix, unspecified trimester: Secondary | ICD-10-CM | POA: Insufficient documentation

## 2012-09-12 LAB — URINE CULTURE: Colony Count: 9000

## 2012-09-12 NOTE — Progress Notes (Signed)
Tricia Wallace is a 21 y.o. female presenting for new ob visit. Certain of LMP, [place] 9/6 at 10 6/7 week agrees with edc. Denies srom, vag bleeding, with on going N, V 5-6 times a day, using zofran 4 mg bid abd stool softner, constipated with only 1 sm bm in past 1 week. @MED  @IPILAPH @ OB History    Grav Para Term Preterm Abortions TAB SAB Ect Mult Living   2 1 1       1      Past Medical History  Diagnosis Date  . S/P LEEP   . Smoker   . H/O varicella   . Abnormal Pap smear 04/2008    LEEP  . Hx: UTI (urinary tract infection)   . Migraine   . Inguinal lymphadenopathy     Right  . GBS carrier   . First trimester bleeding 06/07/2009  . H/O sciatica   . Marijuana use     History of use  . BV (bacterial vaginosis) 2009  . CIN II (cervical intraepithelial neoplasia II) 2009  . Anxiety 06/08/11  . Panic attacks   . Periurethral tear in female 07/04/09  . Labial tear 07/04/09  . H/O: menorrhagia 2011  . Depression 06/08/11    Patient resistance to taking prescribed  antidepressant or getting therapy/counseling - Dr. Normand Sloop   . Postpartum depression 2010   Past Surgical History  Procedure Date  . Wisdom tooth extraction   . Dilation and curettage of uterus    Family History: family history includes Cancer in her maternal grandfather and Diabetes in her brother. Social History:  reports that she has been smoking Cigarettes.  She has been smoking about .5 packs per day. She has never used smokeless tobacco. She reports that she does not drink alcohol or use illicit drugs.  @ROS @    Blood pressure 102/60, weight 109 lb (49.442 kg), last menstrual period 06/13/2012. Physical exam: Calm, no distress, HEENT wnl lungs clear bilaterally, breasts bilaterally no masses, dimpling,or drainage, AP RRR, abd soft, gravid, nt, bowel sounds active, abdomen nontenderNormal hair distrubition mons pubis,  EGBUS WNL,  cerix LTC, no cervical motion tenderness, No adnexal masses or tenderness No edema  to lower extremities Fundus 13 week size  Prenatal labs: ABO, Rh: A/POS/-- (09/06 1545) Antibody: NEG (09/06 1545) Rubella:  immune RPR: NON REAC (09/06 1545)  HBsAg: NEGATIVE (09/06 1545)  HIV: NON REACTIVE (09/06 1545)  GBS:   na  Assessment/Plan: 12 5/[redacted] week ga +2 ketones GC/CHL neg/neg WET PREP yeast txed PAP due in October next visit U;LTRASOUND today for1st trimester screen To MAU for IV hydration discussed, reviewed measures for constipation with enema, glycerin suppository for bm every few days, nutrition for N,V, use of zofran, Collaboration with Dr. Pennie Rushing. Tricia Wallace 09/12/2012, 1:24 PM Tricia Wallace, CNM

## 2012-10-07 ENCOUNTER — Ambulatory Visit: Payer: Self-pay | Admitting: Obstetrics and Gynecology

## 2012-10-08 ENCOUNTER — Observation Stay (HOSPITAL_COMMUNITY)
Admission: AD | Admit: 2012-10-08 | Discharge: 2012-10-09 | Disposition: A | Discharge status: Home / Self Care | Payer: Medicaid Other | Source: Ambulatory Visit | Attending: Obstetrics and Gynecology | Admitting: Obstetrics and Gynecology

## 2012-10-08 ENCOUNTER — Encounter (HOSPITAL_COMMUNITY): Payer: Self-pay | Admitting: Anesthesiology

## 2012-10-08 ENCOUNTER — Other Ambulatory Visit: Payer: Self-pay | Admitting: Obstetrics and Gynecology

## 2012-10-08 ENCOUNTER — Encounter (HOSPITAL_COMMUNITY): Payer: Self-pay | Admitting: *Deleted

## 2012-10-08 ENCOUNTER — Ambulatory Visit (INDEPENDENT_AMBULATORY_CARE_PROVIDER_SITE_OTHER): Payer: Medicaid Other | Admitting: Obstetrics and Gynecology

## 2012-10-08 VITALS — BP 100/62 | Wt 109.0 lb

## 2012-10-08 DIAGNOSIS — O218 Other vomiting complicating pregnancy: Secondary | ICD-10-CM

## 2012-10-08 DIAGNOSIS — R1115 Cyclical vomiting syndrome unrelated to migraine: Secondary | ICD-10-CM

## 2012-10-08 DIAGNOSIS — R824 Acetonuria: Secondary | ICD-10-CM

## 2012-10-08 DIAGNOSIS — Z23 Encounter for immunization: Secondary | ICD-10-CM

## 2012-10-08 DIAGNOSIS — O30009 Twin pregnancy, unspecified number of placenta and unspecified number of amniotic sacs, unspecified trimester: Secondary | ICD-10-CM

## 2012-10-08 DIAGNOSIS — O210 Mild hyperemesis gravidarum: Principal | ICD-10-CM | POA: Insufficient documentation

## 2012-10-08 DIAGNOSIS — E876 Hypokalemia: Secondary | ICD-10-CM | POA: Insufficient documentation

## 2012-10-08 DIAGNOSIS — O30049 Twin pregnancy, dichorionic/diamniotic, unspecified trimester: Secondary | ICD-10-CM | POA: Insufficient documentation

## 2012-10-08 DIAGNOSIS — O30002 Twin pregnancy, unspecified number of placenta and unspecified number of amniotic sacs, second trimester: Secondary | ICD-10-CM

## 2012-10-08 DIAGNOSIS — R1110 Vomiting, unspecified: Secondary | ICD-10-CM

## 2012-10-08 LAB — POCT URINALYSIS DIPSTICK
Spec Grav, UA: 1.02
Urobilinogen, UA: 1

## 2012-10-08 LAB — URINALYSIS, ROUTINE W REFLEX MICROSCOPIC
Bilirubin Urine: NEGATIVE
Glucose, UA: NEGATIVE mg/dL
Hgb urine dipstick: NEGATIVE
Ketones, ur: >80 mg/dL — AB
Nitrite: NEGATIVE
Protein, ur: NEGATIVE mg/dL
Specific Gravity, Urine: >1.03 — ABNORMAL HIGH (ref 1.005–1.030)
Urobilinogen, UA: 0.2 mg/dL (ref 0.0–1.0)
pH: 6 (ref 5.0–8.0)

## 2012-10-08 LAB — AMYLASE: Amylase: 57 U/L (ref 0–105)

## 2012-10-08 LAB — CBC WITH DIFFERENTIAL/PLATELET
Basophils Absolute: 0 10*3/uL (ref 0.0–0.1)
Basophils Relative: 0 % (ref 0–1)
Eosinophils Absolute: 0 10*3/uL (ref 0.0–0.7)
Eosinophils Relative: 1 % (ref 0–5)
HCT: 28.7 % — ABNORMAL LOW (ref 36.0–46.0)
Hemoglobin: 10.2 g/dL — ABNORMAL LOW (ref 12.0–15.0)
Lymphocytes Relative: 23 % (ref 12–46)
Lymphs Abs: 1.8 10*3/uL (ref 0.7–4.0)
MCH: 30.7 pg (ref 26.0–34.0)
MCHC: 35.5 g/dL (ref 30.0–36.0)
MCV: 86.4 fL (ref 78.0–100.0)
Monocytes Absolute: 0.4 10*3/uL (ref 0.1–1.0)
Monocytes Relative: 5 % (ref 3–12)
Neutro Abs: 5.6 10*3/uL (ref 1.7–7.7)
Neutrophils Relative %: 72 % (ref 43–77)
Platelets: 215 10*3/uL (ref 150–400)
RBC: 3.32 MIL/uL — ABNORMAL LOW (ref 3.87–5.11)
RDW: 12.6 % (ref 11.5–15.5)
WBC: 7.8 10*3/uL (ref 4.0–10.5)

## 2012-10-08 LAB — COMPREHENSIVE METABOLIC PANEL
ALT: 13 U/L (ref 0–35)
AST: 15 U/L (ref 0–37)
Albumin: 3 g/dL — ABNORMAL LOW (ref 3.5–5.2)
Alkaline Phosphatase: 51 U/L (ref 39–117)
BUN: 4 mg/dL — ABNORMAL LOW (ref 6–23)
CO2: 24 mEq/L (ref 19–32)
Calcium: 8.9 mg/dL (ref 8.4–10.5)
Chloride: 99 mEq/L (ref 96–112)
Creatinine, Ser: 0.38 mg/dL — ABNORMAL LOW (ref 0.50–1.10)
GFR calc Af Amer: >90 mL/min (ref >90)
GFR calc non Af Amer: >90 mL/min (ref >90)
Glucose, Bld: 99 mg/dL (ref 70–99)
Potassium: 3.1 meq/L — ABNORMAL LOW (ref 3.5–5.1)
Sodium: 134 meq/L — ABNORMAL LOW (ref 135–145)
Total Bilirubin: 0.2 mg/dL — ABNORMAL LOW (ref 0.3–1.2)
Total Protein: 5.7 g/dL — ABNORMAL LOW (ref 6.0–8.3)

## 2012-10-08 LAB — TSH: TSH: 0.015 u[IU]/mL — ABNORMAL LOW (ref 0.350–4.500)

## 2012-10-08 LAB — URINE MICROSCOPIC-ADD ON

## 2012-10-08 LAB — LIPASE, BLOOD: Lipase: 30 U/L (ref 11–59)

## 2012-10-08 MED ORDER — DEXTROSE IN LACTATED RINGERS 5 % IV SOLN
INTRAVENOUS | Status: DC
  Administered 2012-10-09 (×2): via INTRAVENOUS

## 2012-10-08 MED ORDER — PRENATAL MULTIVITAMIN CH
1.0000 | ORAL_TABLET | Freq: Every day | ORAL | Status: DC
Start: 2012-10-09 — End: 2012-10-09

## 2012-10-08 MED ORDER — METHYLPREDNISOLONE 16 MG PO TABS
16.0000 mg | ORAL_TABLET | Freq: Every day | ORAL | Status: DC
  Administered 2012-10-09: 16 mg via ORAL
  Filled 2012-10-08 (×2): qty 1

## 2012-10-08 MED ORDER — LACTATED RINGERS IV BOLUS (SEPSIS)
500.0000 mL | Freq: Once | INTRAVENOUS | Status: AC
  Administered 2012-10-08: 500 mL via INTRAVENOUS

## 2012-10-08 MED ORDER — PROMETHAZINE HCL 12.5 MG PO TABS: 12.5000 mg | ORAL_TABLET | Freq: Every day | ORAL | Status: DC

## 2012-10-08 MED ORDER — CALCIUM CARBONATE ANTACID 500 MG PO CHEW: 2.0000 | CHEWABLE_TABLET | ORAL | Status: DC | PRN

## 2012-10-08 MED ORDER — METHYLPREDNISOLONE SODIUM SUCC 125 MG IJ SOLR
48.0000 mg | Freq: Once | INTRAMUSCULAR | Status: AC
  Administered 2012-10-08: 48 mg via INTRAVENOUS
  Filled 2012-10-08: qty 0.77

## 2012-10-08 MED ORDER — PANTOPRAZOLE SODIUM 40 MG IV SOLR
40.0000 mg | Freq: Every day | INTRAVENOUS | Status: DC
  Administered 2012-10-08: 40 mg via INTRAVENOUS
  Filled 2012-10-08 (×2): qty 40

## 2012-10-08 MED ORDER — ZOLPIDEM TARTRATE 5 MG PO TABS: 5.0000 mg | ORAL_TABLET | Freq: Every evening | ORAL | Status: DC | PRN

## 2012-10-08 MED ORDER — METHYLPREDNISOLONE 4 MG PO TABS: 4.0000 mg | ORAL_TABLET | Freq: Every day | ORAL | Status: DC

## 2012-10-08 MED ORDER — LACTATED RINGERS IV SOLN: INTRAVENOUS | Status: DC

## 2012-10-08 MED ORDER — SCOPOLAMINE 1 MG/3DAYS TD PT72
1.0000 | MEDICATED_PATCH | TRANSDERMAL | Status: DC
  Administered 2012-10-08: 1.5 mg via TRANSDERMAL
  Filled 2012-10-08: qty 1

## 2012-10-08 MED ORDER — METHYLPREDNISOLONE 4 MG PO TABS: 8.0000 mg | ORAL_TABLET | Freq: Every day | ORAL | Status: DC

## 2012-10-08 MED ORDER — ONDANSETRON HCL 4 MG/2ML IJ SOLN: 4.0000 mg | Freq: Three times a day (TID) | INTRAMUSCULAR | Status: DC

## 2012-10-08 MED ORDER — ACETAMINOPHEN 325 MG PO TABS: 650.0000 mg | ORAL_TABLET | ORAL | Status: DC | PRN

## 2012-10-08 MED ORDER — ONDANSETRON HCL 8 MG PO TABS: 8.0000 mg | ORAL_TABLET | Freq: Three times a day (TID) | ORAL | Status: DC | PRN

## 2012-10-08 MED ORDER — DOCUSATE SODIUM 100 MG PO CAPS: 100.0000 mg | ORAL_CAPSULE | Freq: Every day | ORAL | Status: DC

## 2012-10-08 MED ORDER — METHYLPREDNISOLONE 16 MG PO TABS
16.0000 mg | ORAL_TABLET | Freq: Every day | ORAL | Status: DC
  Filled 2012-10-08: qty 1

## 2012-10-08 MED ORDER — ONDANSETRON 8 MG/NS 50 ML IVPB
8.0000 mg | Freq: Three times a day (TID) | INTRAVENOUS | Status: DC
  Administered 2012-10-08: 8 mg via INTRAVENOUS
  Filled 2012-10-08 (×4): qty 8

## 2012-10-08 NOTE — Progress Notes (Signed)
Pt stated no issues today. Pt stated last Sunday she passed out in [facility] . Pt stated she does not want the flu shot .

## 2012-10-08 NOTE — H&P (Signed)
Tricia Wallace is a 21 y.o. female, G2P1001P1 at [redacted]w[redacted]d,  presenting for severe Nausea and vomiting with Twin pregnancy. The patient has had Hyperemesis Gravidarum since 5 weeks of pregnancy.  Patient Active Problem List  Diagnosis  . Twin dichorionic diamniotic placenta  . History of cervical LEEP biopsy affecting care of mother, antepartum  . Flu vaccine need  . Hyperemesis complicating pregnancy, antepartum    History of present pregnancy: Patient entered care at 5 weeks Twin pregnancy Di/Di.  EDC of 03/20/13 was established by LMp and 5 weeks USS.  Last USS was at [redacted]w[redacted]d for Fetal Viability, with normal findings and an Di/Di placenta anterior.   Her prenatal course has been complicated by severe Hyperemesis since 5 weeks of Pregnancy. The patient has lost 11 lbs in weight in the past 10 weeks. At her last evalution,was in MAU prior to admission. Her condition was assessed and a consultation with Dr Stefano Gaul was made. There was a collaborative agreement to have the patient admitted for IV hydration, Anti-emetics and Steroid Taper.    OB History    Grav Para Term Preterm Abortions TAB SAB Ect Mult Living   2 1 1       1      Past Medical History  Diagnosis Date  . S/P LEEP   . Smoker   . H/O varicella   . Abnormal Pap smear 04/2008    LEEP  . Hx: UTI (urinary tract infection)   . GBS carrier   . First trimester bleeding 06/07/2009  . H/O sciatica   . Marijuana use     History of use  . BV (bacterial vaginosis) 2009  . CIN II (cervical intraepithelial neoplasia II) 2009  . Periurethral tear in female 07/04/09  . Labial tear 07/04/09  . H/O: menorrhagia 2011  . Inguinal lymphadenopathy     Right   Past Surgical History  Procedure Date  . Wisdom tooth extraction   . Dilation and curettage of uterus   . Leep    Family History: family history includes Cancer in her maternal grandfather and Diabetes in her brother. Social History:  reports that she has been smoking Cigarettes.   She has been smoking about .5 packs per day. She has never used smokeless tobacco. She reports that she does not drink alcohol or use illicit drugs.  ROS:  21 y/o Female Di/Di Twin Pregnancy with Hyperemesis Gravidarum.  No Known Allergies     Blood pressure 95/50, pulse 78, temperature 98.3 F (36.8 C), temperature source Oral, resp. rate 16, height 5\' 6"  (1.676 m), weight 110 lb (49.896 kg), last menstrual period 06/13/2012, SpO2 97.00%.  Chest clear Heart RRR without murmur Abd gravid, NT, FH to dates Pelvic: not done  FHR: Fht's A :135  and Fht's B: 145 UCs:  None.  Prenatal labs: ABO, Rh: A/POS/-- (09/06 1545) Antibody: NEG (09/06 1545) Rubella:   Immune RPR: NON REAC (09/06 1545)  HBsAg: NEGATIVE (09/06 1545)  HIV: NON REACTIVE (09/06 1545)  GBS:  unknown at this time but not present in urine early pregnancy Sickle cell/Hgb electrophoresis:  neg Pap:  Result unavailable  GC: neg Chlamydia:  neg Genetic screenings:  Not competed at this time Glucola:  Not completed as too early in pregnancy  Results for orders placed during the hospital encounter of 10/08/12 (from the past 24 hour(s))  URINALYSIS, ROUTINE W REFLEX MICROSCOPIC     Status: Abnormal   Collection Time   10/08/12  7:40 PM  Component Value Range   Color, Urine YELLOW  YELLOW   APPearance TURBID (*) CLEAR   Specific Gravity, Urine >1.030 (*) 1.005 - 1.030   pH 6.0  5.0 - 8.0   Glucose, UA NEGATIVE  NEGATIVE mg/dL   Hgb urine dipstick NEGATIVE  NEGATIVE   Bilirubin Urine NEGATIVE  NEGATIVE   Ketones, ur >80 (*) NEGATIVE mg/dL   Protein, ur NEGATIVE  NEGATIVE mg/dL   Urobilinogen, UA 0.2  0.0 - 1.0 mg/dL   Nitrite NEGATIVE  NEGATIVE   Leukocytes, UA SMALL (*) NEGATIVE  URINE MICROSCOPIC-ADD ON     Status: Abnormal   Collection Time   10/08/12  7:40 PM      Component Value Range   Squamous Epithelial / LPF MANY (*) RARE   WBC, UA 11-20  <3 WBC/hpf   Bacteria, UA MANY (*) RARE   Urine-Other  MUCOUS PRESENT    AMYLASE     Status: Normal   Collection Time   10/08/12  8:00 PM      Component Value Range   Amylase 57  0 - 105 U/L  COMPREHENSIVE METABOLIC PANEL     Status: Abnormal   Collection Time   10/08/12  8:00 PM      Component Value Range   Sodium 134 (*) 135 - 145 mEq/L   Potassium 3.1 (*) 3.5 - 5.1 mEq/L   Chloride 99  96 - 112 mEq/L   CO2 24  19 - 32 mEq/L   Glucose, Bld 99  70 - 99 mg/dL   BUN 4 (*) 6 - 23 mg/dL   Creatinine, Ser 1.61 (*) 0.50 - 1.10 mg/dL   Calcium 8.9  8.4 - 09.6 mg/dL   Total Protein 5.7 (*) 6.0 - 8.3 g/dL   Albumin 3.0 (*) 3.5 - 5.2 g/dL   AST 15  0 - 37 U/L   ALT 13  0 - 35 U/L   Alkaline Phosphatase 51  39 - 117 U/L   Total Bilirubin 0.2 (*) 0.3 - 1.2 mg/dL   GFR calc non Af Amer >90  >90 mL/min   GFR calc Af Amer >90  >90 mL/min  LIPASE, BLOOD     Status: Normal   Collection Time   10/08/12  8:00 PM      Component Value Range   Lipase 30  11 - 59 U/L  CBC WITH DIFFERENTIAL     Status: Abnormal   Collection Time   10/08/12  8:00 PM      Component Value Range   WBC 7.8  4.0 - 10.5 K/uL   RBC 3.32 (*) 3.87 - 5.11 MIL/uL   Hemoglobin 10.2 (*) 12.0 - 15.0 g/dL   HCT 04.5 (*) 40.9 - 81.1 %   MCV 86.4  78.0 - 100.0 fL   MCH 30.7  26.0 - 34.0 pg   MCHC 35.5  30.0 - 36.0 g/dL   RDW 91.4  78.2 - 95.6 %   Platelets 215  150 - 400 K/uL   Neutrophils Relative 72  43 - 77 %   Neutro Abs 5.6  1.7 - 7.7 K/uL   Lymphocytes Relative 23  12 - 46 %   Lymphs Abs 1.8  0.7 - 4.0 K/uL   Monocytes Relative 5  3 - 12 %   Monocytes Absolute 0.4  0.1 - 1.0 K/uL   Eosinophils Relative 1  0 - 5 %   Eosinophils Absolute 0.0  0.0 - 0.7 K/uL   Basophils Relative  0  0 - 1 %   Basophils Absolute 0.0  0.0 - 0.1 K/uL        Assessment/Plan: IUP at [redacted]w[redacted]d Di./Di Twin pregnancy with Hyperemesis Gravidarum and weight loss 11 lbs in past 10 years. The patient is anemic but at this stage of pregnancy due to Hyperemesis Gravidarum, is unable to tolerate PNV  po daily.   Plan: Admit for IV hydration, Anti-emetics, Taper Steroids and Lab Work surveillance. Dietician Consult.  Kenna Kirn, [place], [place] 10/08/2012, 10:32 PM

## 2012-10-08 NOTE — MAU Provider Note (Signed)
History    This patient is 21 y/o female at [redacted]w[redacted]d GA. G2P1001. Twin Pregnancy. Di/Di Twin Pregnancy and had had considerable weight loss due to Hyperemesis Gravidarum. Had been seen by Dr. Estanislado Pandy today at the office and has not been taking Zofran 8 mgs po as scheduled. Has had severe nausea and vomiting with this Twin pregnancy and has an appetite to eat but within 1 - 2 hrs all her food will be vomited back up. She describes herself as "Starving". The patient has had syncope attacks and had had 11 lbs weight loss to date. The patient has been admitted to MAU for IV hydration, Lab investigations, Anti- emetic Therapy and possible start of Tapered Steroid Course for this condition. The patient is aware that she may remain over night for stabilization of her condition.  CSN: [id number]  Arrival date and time: 10/08/12 1937   None     No chief complaint on file.  HPI  OB History    Grav Para Term Preterm Abortions TAB SAB Ect Mult Living   2 1 1       1       Past Medical History  Diagnosis Date  . S/P LEEP   . Smoker   . H/O varicella   . Abnormal Pap smear 04/2008    LEEP  . Hx: UTI (urinary tract infection)   . Migraine   . Inguinal lymphadenopathy     Right  . GBS carrier   . First trimester bleeding 06/07/2009  . H/O sciatica   . Marijuana use     History of use  . BV (bacterial vaginosis) 2009  . CIN II (cervical intraepithelial neoplasia II) 2009  . Anxiety 06/08/11  . Panic attacks   . Periurethral tear in female 07/04/09  . Labial tear 07/04/09  . H/O: menorrhagia 2011  . Depression 06/08/11    Patient resistance to taking prescribed  antidepressant or getting therapy/counseling - Dr. Normand Sloop   . Postpartum depression 2010    Past Surgical History  Procedure Date  . Wisdom tooth extraction   . Dilation and curettage of uterus   . Leep     Family History  Problem Relation Age of Onset  . Diabetes Brother   . Cancer Maternal Grandfather     Lung     History  Substance Use Topics  . Smoking status: Current Every Day Smoker -- 0.5 packs/day    Types: Cigarettes  . Smokeless tobacco: Never Used  . Alcohol Use: No    Allergies: No Known Allergies  Prescriptions prior to admission  Medication Sig Dispense Refill  . ondansetron (ZOFRAN) 8 MG tablet Take 1 tablet (8 mg total) by mouth every 8 (eight) hours as needed for nausea.  20 tablet  8  . ondansetron (ZOFRAN-ODT) 8 MG disintegrating tablet Take 1 tablet (8 mg total) by mouth every 8 (eight) hours as needed. For nausea  20 tablet  0  . promethazine (PHENERGAN) 12.5 MG tablet Take 1 tablet (12.5 mg total) by mouth at bedtime.  30 tablet  8    Review of Systems  Constitutional: Positive for weight loss.       Due to Hyperemesis Gravidarum with Twin Pregnancy.  HENT: Negative.   Eyes: Negative.   Respiratory: Negative.   Cardiovascular: Negative.   Gastrointestinal: Positive for nausea and vomiting.       Persistent Nausea and vomiting with this pregnancy  Genitourinary: Negative.   Musculoskeletal: Negative.   Skin: Negative.  Neurological: Positive for weakness.  Endo/Heme/Allergies: Negative.   Psychiatric/Behavioral: Positive for depression.       History of depression and panic attacks.   Physical Exam   Last menstrual period 06/13/2012.  Physical Exam  Constitutional: She is oriented to person, place, and time.       Under nourished and has had significant weight lass with this pregnancy due to Hyperemesis Gravidarum.  HENT:  Head: Normocephalic and atraumatic.  Eyes: Conjunctivae normal and EOM are normal. Pupils are equal, round, and reactive to light.  Neck: Normal range of motion. Neck supple.  Respiratory: Effort normal and breath sounds normal.  GI: Soft. Bowel sounds are normal.  Musculoskeletal: Normal range of motion.  Neurological: She is alert and oriented to person, place, and time. She has normal reflexes.  Skin: Skin is warm and dry.   Psychiatric: She has a normal mood and affect.    MAU Course  Procedures Labs: CBC with Diff          CMP          Amylase and Lipase          Pre- Albumin          TSH            U/A, microscopy          IV Fluids : LR /hr                  LR 250/ hr                  D5/ LR at 171ml/ hr   Maintenance while in MAU  IV Medications: Zofran 8 mgs  X 1 dose.                          Protonix 40mg s  X 1 dose  Transdermal: Scopolamine 1.5 mg patch to place behind ear.  Assessment and Plan  Consultation with Dr Stefano Gaul and he is in agreement for ms Nance to be admitted overnight for surveillance and to commence on tapered Steroid Course.  Larosa Rhines, CNM. 10/08/2012, 7:40 PM

## 2012-10-08 NOTE — MAU Note (Signed)
Patient is in with c/o n/v, no relief from zofran. She denies pain or vaginal bleeding. She is having twins. g2p1

## 2012-10-08 NOTE — Progress Notes (Signed)
[redacted]w[redacted]d Severe vomiting, not keeping any food and barely keeping fluids. Multiple near syncopal episodes. Lost 11 lbs so far. Chem 10: trace ketones To MAU for IV fluids and to start Prednisone taper. Pt informed that she may need to stay overnight.

## 2012-10-08 NOTE — MAU Note (Signed)
Was seen in doctors office today, has not been able to keep anything down

## 2012-10-08 NOTE — Progress Notes (Signed)
Pharmacy Consult:   MEDROL (METHYLPREDNISOLONE) TAPER  FOR HYPEREMESIS GRAVIDARUM PATIENTS  The following is a 14 day taper of methylprednisolone for hyperemesis. Doses on day 1  will be given IV.  All doses starting on day 2  will be given PO. (If patient cannot tolerate oral medications, contact the pharmacy to change route to IV.)   Date Day Morning Midday Bedtime  10/17 1   48mg   10/18 2 16  mg 16 mg 16 mg  10/19 3 16  mg 16 mg 16 mg  10/20 4 16  mg 8 mg 16 mg  10/21 5 16  mg 8 mg 8 mg  10/22 6 8  mg 8 mg 8 mg  10/23 7 8  mg 4 mg 8 mg  10/24 8 8  mg 4 mg 4 mg  10/25 9 8  mg 4 mg   10/26 10 8  mg 4 mg   10/27 11 8  mg    10/28 12 8  mg    10/29 13 4  mg    10/30 14 4  mg     Check fasting blood sugars daily while on the taper. Notify MD if fasting blood sugar>95.  Claybon Jabs 10/08/2012

## 2012-10-09 DIAGNOSIS — O218 Other vomiting complicating pregnancy: Secondary | ICD-10-CM

## 2012-10-09 DIAGNOSIS — O330 Maternal care for disproportion due to deformity of maternal pelvic bones: Secondary | ICD-10-CM

## 2012-10-09 LAB — GLUCOSE, CAPILLARY: Glucose-Capillary: 147 mg/dL — ABNORMAL HIGH (ref 70–99)

## 2012-10-09 LAB — PREALBUMIN: Prealbumin: 18.1 mg/dL (ref 17.0–34.0)

## 2012-10-09 MED ORDER — PANTOPRAZOLE SODIUM 40 MG PO TBEC: 40.0000 mg | DELAYED_RELEASE_TABLET | Freq: Every day | ORAL | Status: DC

## 2012-10-09 MED ORDER — MEDROL 8 MG PO TABS: 8.0000 mg | ORAL_TABLET | ORAL | Status: DC

## 2012-10-09 NOTE — Progress Notes (Signed)
History  Tricia Wallace is a 21 y.o. G2P1001 at [redacted]w[redacted]d   Chief Complaint  Patient presents with  . Emesis  States "feeling better I actually ate 2 meals, no nausea or vomiting, ready to go home". Has RX phenergan.   [redacted]w[redacted]d   Chief Complaint  Patient presents with  . Emesis   @SFHPI @  Prior to Admission medications   Medication Sig Start Date End Date Taking? Authorizing Provider  ondansetron (ZOFRAN-ODT) 8 MG disintegrating tablet Take 1 tablet (8 mg total) by mouth every 8 (eight) hours as needed. For nausea 09/10/12  Yes Malissa Hippo, CNM  promethazine (PHENERGAN) 12.5 MG tablet Take 1 tablet (12.5 mg total) by mouth at bedtime. 10/08/12   Esmeralda Arthur, MD    Patient Active Problem List  Diagnosis  . Twin dichorionic diamniotic placenta  . History of cervical LEEP biopsy affecting care of mother, antepartum  . Flu vaccine need  . Hyperemesis complicating pregnancy, antepartum   Vitals:  Blood pressure 88/45, pulse 70, temperature 99.3 F (37.4 C), temperature source Oral, resp. rate 16, height 5\' 6"  (1.676 m), weight 112 lb 4 oz (50.916 kg), last menstrual period 06/13/2012, SpO2 91.00%. OB History    Grav Para Term Preterm Abortions TAB SAB Ect Mult Living   2 1 1       1       Past Medical History  Diagnosis Date  . S/P LEEP   . Smoker   . H/O varicella   . Abnormal Pap smear 04/2008    LEEP  . Hx: UTI (urinary tract infection)   . GBS carrier   . First trimester bleeding 06/07/2009  . H/O sciatica   . Marijuana use     History of use  . BV (bacterial vaginosis) 2009  . CIN II (cervical intraepithelial neoplasia II) 2009  . Periurethral tear in female 07/04/09  . Labial tear 07/04/09  . H/O: menorrhagia 2011    Past Surgical History  Procedure Date  . Wisdom tooth extraction   . Dilation and curettage of uterus   . Leep     Family History  Problem Relation Age of Onset  . Diabetes Brother   . Cancer Maternal Grandfather     Lung    History    Substance Use Topics  . Smoking status: Current Every Day Smoker -- 0.5 packs/day    Types: Cigarettes  . Smokeless tobacco: Never Used  . Alcohol Use: No    Allergies: No Known Allergies  Prescriptions prior to admission  Medication Sig Dispense Refill  . ondansetron (ZOFRAN-ODT) 8 MG disintegrating tablet Take 1 tablet (8 mg total) by mouth every 8 (eight) hours as needed. For nausea  20 tablet  0  . promethazine (PHENERGAN) 12.5 MG tablet Take 1 tablet (12.5 mg total) by mouth at bedtime.  30 tablet  8    @ROS @ Physical Exam   Blood pressure 88/45, pulse 70, temperature 99.3 F (37.4 C), temperature source Oral, resp. rate 16, height 5\' 6"  (1.676 m), weight 112 lb 4 oz (50.916 kg), last menstrual period 06/13/2012, SpO2 91.00%.  @PHYSEXAMBYAGE2 @ Labs:  Recent Results (from the past 24 hour(s))  POCT URINALYSIS DIPSTICK   Collection Time   10/08/12  5:32 PM      Component Value Range   Color, UA       Clarity, UA       Glucose, UA T     Bilirubin, UA  Ketones, UA T     Spec Grav, UA 1.020     Blood, UA       pH, UA       Protein, UA       Urobilinogen, UA 1.0     Nitrite, UA       Leukocytes, UA Trace    URINALYSIS, ROUTINE W REFLEX MICROSCOPIC   Collection Time   10/08/12  7:40 PM      Component Value Range   Color, Urine YELLOW  YELLOW   APPearance TURBID (*) CLEAR   Specific Gravity, Urine >1.030 (*) 1.005 - 1.030   pH 6.0  5.0 - 8.0   Glucose, UA NEGATIVE  NEGATIVE mg/dL   Hgb urine dipstick NEGATIVE  NEGATIVE   Bilirubin Urine NEGATIVE  NEGATIVE   Ketones, ur >80 (*) NEGATIVE mg/dL   Protein, ur NEGATIVE  NEGATIVE mg/dL   Urobilinogen, UA 0.2  0.0 - 1.0 mg/dL   Nitrite NEGATIVE  NEGATIVE   Leukocytes, UA SMALL (*) NEGATIVE  URINE MICROSCOPIC-ADD ON   Collection Time   10/08/12  7:40 PM      Component Value Range   Squamous Epithelial / LPF MANY (*) RARE   WBC, UA 11-20  <3 WBC/hpf   Bacteria, UA MANY (*) RARE   Urine-Other MUCOUS PRESENT     AMYLASE   Collection Time   10/08/12  8:00 PM      Component Value Range   Amylase 57  0 - 105 U/L  COMPREHENSIVE METABOLIC PANEL   Collection Time   10/08/12  8:00 PM      Component Value Range   Sodium 134 (*) 135 - 145 mEq/L   Potassium 3.1 (*) 3.5 - 5.1 mEq/L   Chloride 99  96 - 112 mEq/L   CO2 24  19 - 32 mEq/L   Glucose, Bld 99  70 - 99 mg/dL   BUN 4 (*) 6 - 23 mg/dL   Creatinine, Ser 1.61 (*) 0.50 - 1.10 mg/dL   Calcium 8.9  8.4 - 09.6 mg/dL   Total Protein 5.7 (*) 6.0 - 8.3 g/dL   Albumin 3.0 (*) 3.5 - 5.2 g/dL   AST 15  0 - 37 U/L   ALT 13  0 - 35 U/L   Alkaline Phosphatase 51  39 - 117 U/L   Total Bilirubin 0.2 (*) 0.3 - 1.2 mg/dL   GFR calc non Af Amer >90  >90 mL/min   GFR calc Af Amer >90  >90 mL/min  LIPASE, BLOOD   Collection Time   10/08/12  8:00 PM      Component Value Range   Lipase 30  11 - 59 U/L  PREALBUMIN   Collection Time   10/08/12  8:00 PM      Component Value Range   Prealbumin 18.1  17.0 - 34.0 mg/dL  TSH   Collection Time   10/08/12  8:00 PM      Component Value Range   TSH 0.015 (*) 0.350 - 4.500 uIU/mL  CBC WITH DIFFERENTIAL   Collection Time   10/08/12  8:00 PM      Component Value Range   WBC 7.8  4.0 - 10.5 K/uL   RBC 3.32 (*) 3.87 - 5.11 MIL/uL   Hemoglobin 10.2 (*) 12.0 - 15.0 g/dL   HCT 04.5 (*) 40.9 - 81.1 %   MCV 86.4  78.0 - 100.0 fL   MCH 30.7  26.0 - 34.0 pg   MCHC  35.5  30.0 - 36.0 g/dL   RDW 16.1  09.6 - 04.5 %   Platelets 215  150 - 400 K/uL   Neutrophils Relative 72  43 - 77 %   Neutro Abs 5.6  1.7 - 7.7 K/uL   Lymphocytes Relative 23  12 - 46 %   Lymphs Abs 1.8  0.7 - 4.0 K/uL   Monocytes Relative 5  3 - 12 %   Monocytes Absolute 0.4  0.1 - 1.0 K/uL   Eosinophils Relative 1  0 - 5 %   Eosinophils Absolute 0.0  0.0 - 0.7 K/uL   Basophils Relative 0  0 - 1 %   Basophils Absolute 0.0  0.0 - 0.1 K/uL  GLUCOSE, CAPILLARY   Collection Time   10/09/12  5:54 AM      Component Value Range   Glucose-Capillary  147 (*) 70 - 99 mg/dL   Comment 1 Notify RN     ASSESSMENT: Patient Active Problem List  Diagnosis  . Twin dichorionic diamniotic placenta  . History of cervical LEEP biopsy affecting care of mother, antepartum  . Flu vaccine need  . Hyperemesis complicating pregnancy, antepartum   Physical Examination: Calm, no distress, cooperative, HEENT WNL grossly,  lungs clear bilaterally, AP RRR,  abd soft nt, Fhts x 2 present No edema to lower extremities -Homan's sign bilaterally  Intake/Output Summary (Last 24 hours) at 10/09/12 1341 Last data filed at 10/09/12 1300  Gross per 24 hour  Intake 2822.73 ml  Output    650 ml  Net 2172.73 ml  Total I/O In: 480 [P.O.:480] Out: 350 [Urine:350]  ED Course  Assessment/Plan Hyperemesis [redacted]W[redacted]D twin gestation hypokalemia scopalamine patch removed, discontinued now. Will update Dr. Pennie Rushing.  Lavera Guise, CNM

## 2012-10-09 NOTE — Discharge Summary (Signed)
Tricia Wallace is a 21 y.o. G2P1001 at [redacted]w[redacted]d presented hyperemesis  Admission diagnosis: ketonuria 80, hyperemesis, 16 week twin gestation  Discharge diagnosis: hyperemesis, 16 week twin gestation, N,V resolved  Treatment: IV hydration, zofran, medrol         States "feeling better I actually ate 2 meals, no nausea or vomiting, ready to go home". Has RX phenergan.   [redacted]w[redacted]d  Chief Complaint   Patient presents with   .  Emesis    @SFHPI @  Prior to Admission medications   Medication  Sig  Start Date  End Date  Taking?  Authorizing Provider   ondansetron (ZOFRAN-ODT) 8 MG disintegrating tablet  Take 1 tablet (8 mg total) by mouth every 8 (eight) hours as needed. For nausea  09/10/12   Yes  Malissa Hippo, CNM   promethazine (PHENERGAN) 12.5 MG tablet  Take 1 tablet (12.5 mg total) by mouth at bedtime.  10/08/12    Esmeralda Arthur, MD    Patient Active Problem List   Diagnosis   .  Twin dichorionic diamniotic placenta   .  History of cervical LEEP biopsy affecting care of mother, antepartum   .  Flu vaccine need   .  Hyperemesis complicating pregnancy, antepartum    Vitals: Blood pressure 88/45, pulse 70, temperature 99.3 F (37.4 C), temperature source Oral, resp. rate 16, height 5\' 6"  (1.676 m), weight 112 lb 4 oz (50.916 kg), last menstrual period 06/13/2012, SpO2 91.00%.  OB History    Grav  Para  Term  Preterm  Abortions  TAB  SAB  Ect  Mult  Living    2  1  1        1       Past Medical History   Diagnosis  Date   .  S/P LEEP    .  Smoker    .  H/O varicella    .  Abnormal Pap smear  04/2008     LEEP   .  Hx: UTI (urinary tract infection)    .  GBS carrier    .  First trimester bleeding  06/07/2009   .  H/O sciatica    .  Marijuana use      History of use   .  BV (bacterial vaginosis)  2009   .  CIN II (cervical intraepithelial neoplasia II)  2009   .  Periurethral tear in female  07/04/09   .  Labial tear  07/04/09   .  H/O: menorrhagia  2011    Past  Surgical History   Procedure  Date   .  Wisdom tooth extraction    .  Dilation and curettage of uterus    .  Leep     Family History   Problem  Relation  Age of Onset   .  Diabetes  Brother    .  Cancer  Maternal Grandfather       Lung    History   Substance Use Topics   .  Smoking status:  Current Every Day Smoker -- 0.5 packs/day     Types:  Cigarettes   .  Smokeless tobacco:  Never Used   .  Alcohol Use:  No    Allergies: No Known Allergies  Prescriptions prior to admission   Medication  Sig  Dispense  Refill   .  ondansetron (ZOFRAN-ODT) 8 MG disintegrating tablet  Take 1 tablet (8 mg total) by mouth every 8 (eight) hours as  needed. For nausea  20 tablet  0   .  promethazine (PHENERGAN) 12.5 MG tablet  Take 1 tablet (12.5 mg total) by mouth at bedtime.  30 tablet  8    @ROS @  Physical Exam   Blood pressure 88/45, pulse 70, temperature 99.3 F (37.4 C), temperature source Oral, resp. rate 16, height 5\' 6"  (1.676 m), weight 112 lb 4 oz (50.916 kg), last menstrual period 06/13/2012, SpO2 91.00%.  @PHYSEXAMBYAGE2 @  Labs:  Recent Results (from the past 24 hour(s))   POCT URINALYSIS DIPSTICK    Collection Time    10/08/12 5:32 PM   Component  Value  Range    Color, UA      Clarity, UA      Glucose, UA  T     Bilirubin, UA      Ketones, UA  T     Spec Grav, UA  1.020     Blood, UA      pH, UA      Protein, UA      Urobilinogen, UA  1.0     Nitrite, UA      Leukocytes, UA  Trace    URINALYSIS, ROUTINE W REFLEX MICROSCOPIC    Collection Time    10/08/12 7:40 PM   Component  Value  Range    Color, Urine  YELLOW  YELLOW    APPearance  TURBID (*)  CLEAR    Specific Gravity, Urine  >1.030 (*)  1.005 - 1.030    pH  6.0  5.0 - 8.0    Glucose, UA  NEGATIVE  NEGATIVE mg/dL    Hgb urine dipstick  NEGATIVE  NEGATIVE    Bilirubin Urine  NEGATIVE  NEGATIVE    Ketones, ur  >80 (*)  NEGATIVE mg/dL    Protein, ur  NEGATIVE  NEGATIVE mg/dL    Urobilinogen, UA  0.2  0.0 - 1.0  mg/dL    Nitrite  NEGATIVE  NEGATIVE    Leukocytes, UA  SMALL (*)  NEGATIVE   URINE MICROSCOPIC-ADD ON    Collection Time    10/08/12 7:40 PM   Component  Value  Range    Squamous Epithelial / LPF  MANY (*)  RARE    WBC, UA  11-20  <3 WBC/hpf    Bacteria, UA  MANY (*)  RARE    Urine-Other  MUCOUS PRESENT    AMYLASE    Collection Time    10/08/12 8:00 PM   Component  Value  Range    Amylase  57  0 - 105 U/L   COMPREHENSIVE METABOLIC PANEL    Collection Time    10/08/12 8:00 PM   Component  Value  Range    Sodium  134 (*)  135 - 145 mEq/L    Potassium  3.1 (*)  3.5 - 5.1 mEq/L    Chloride  99  96 - 112 mEq/L    CO2  24  19 - 32 mEq/L    Glucose, Bld  99  70 - 99 mg/dL    BUN  4 (*)  6 - 23 mg/dL    Creatinine, Ser  8.11 (*)  0.50 - 1.10 mg/dL    Calcium  8.9  8.4 - 10.5 mg/dL    Total Protein  5.7 (*)  6.0 - 8.3 g/dL    Albumin  3.0 (*)  3.5 - 5.2 g/dL    AST  15  0 - 37 U/L    ALT  13  0 - 35 U/L    Alkaline Phosphatase  51  39 - 117 U/L    Total Bilirubin  0.2 (*)  0.3 - 1.2 mg/dL    GFR calc non Af Amer  >90  >90 mL/min    GFR calc Af Amer  >90  >90 mL/min   LIPASE, BLOOD    Collection Time    10/08/12 8:00 PM   Component  Value  Range    Lipase  30  11 - 59 U/L   PREALBUMIN    Collection Time    10/08/12 8:00 PM   Component  Value  Range    Prealbumin  18.1  17.0 - 34.0 mg/dL   TSH    Collection Time    10/08/12 8:00 PM   Component  Value  Range    TSH  0.015 (*)  0.350 - 4.500 uIU/mL   CBC WITH DIFFERENTIAL    Collection Time    10/08/12 8:00 PM   Component  Value  Range    WBC  7.8  4.0 - 10.5 K/uL    RBC  3.32 (*)  3.87 - 5.11 MIL/uL    Hemoglobin  10.2 (*)  12.0 - 15.0 g/dL    HCT  40.9 (*)  81.1 - 46.0 %    MCV  86.4  78.0 - 100.0 fL    MCH  30.7  26.0 - 34.0 pg    MCHC  35.5  30.0 - 36.0 g/dL    RDW  91.4  78.2 - 95.6 %    Platelets  215  150 - 400 K/uL    Neutrophils Relative  72  43 - 77 %    Neutro Abs  5.6  1.7 - 7.7 K/uL    Lymphocytes  Relative  23  12 - 46 %    Lymphs Abs  1.8  0.7 - 4.0 K/uL    Monocytes Relative  5  3 - 12 %    Monocytes Absolute  0.4  0.1 - 1.0 K/uL    Eosinophils Relative  1  0 - 5 %    Eosinophils Absolute  0.0  0.0 - 0.7 K/uL    Basophils Relative  0  0 - 1 %    Basophils Absolute  0.0  0.0 - 0.1 K/uL   GLUCOSE, CAPILLARY    Collection Time    10/09/12 5:54 AM   Component  Value  Range    Glucose-Capillary  147 (*)  70 - 99 mg/dL    Comment 1  Notify RN      Physical Examination:  Calm, no distress, cooperative, HEENT WNL grossly,  lungs clear bilaterally, AP RRR,  abd soft nt, Fhts x 2 present  No edema to lower extremities  -Homan's sign bilaterally   Intake/Output Summary (Last 24 hours) at 10/09/12 1341 Last data filed at 10/09/12 1300   Gross per 24 hour   Intake  2822.73 ml   Output  650 ml   Net  2172.73 ml   Total I/O  In: 480 [P.O.:480]  Out: 350 [Urine:350]  ED Course   Assessment/Plan  Hyperemesis  [redacted]W[redacted]D twin gestation  hypokalemia  scopalamine patch removed, discontinued now. Discharge home, RX medrol discussed and hyperemesis gravidarum taper instruction sheet given to pt, continue zofran and phenergan discussed, orange juice or banana daily if unable to tolerate with check BMP next office visit, collaboration with Dr. Pennie Rushing per telephone.  Lavera Guise, CNM

## 2012-10-09 NOTE — Progress Notes (Signed)
INITIAL ADULT NUTRITION ASSESSMENT Date: 10/09/2012   Time: 12:18 PM INTERVENTION: Antenatal Regular diet,includes snacks TID Reinforced diet for hyperemesis  Reason for Assessment: Hyperemesis  ASSESSMENT: Female 21 y.o.  Dx: Patient Active Problem List  Diagnosis  . Twin dichorionic diamniotic placenta  . History of cervical LEEP biopsy affecting care of mother, antepartum  . Flu vaccine need  . Hyperemesis complicating pregnancy, antepartum    Hx:  Past Medical History  Diagnosis Date  . S/P LEEP   . Smoker   . H/O varicella   . Abnormal Pap smear 04/2008    LEEP  . Hx: UTI (urinary tract infection)   . GBS carrier   . First trimester bleeding 06/07/2009  . H/O sciatica   . Marijuana use     History of use  . BV (bacterial vaginosis) 2009  . CIN II (cervical intraepithelial neoplasia II) 2009  . Periurethral tear in female 07/04/09  . Labial tear 07/04/09  . H/O: menorrhagia 2011   Related Meds:     . docusate sodium  100 mg Oral Daily  . lactated ringers  500 mL Intravenous Once  . methylPREDNISolone  16 mg Oral Q breakfast   Followed by  . methylPREDNISolone  8 mg Oral Q breakfast   Followed by  . methylPREDNISolone  4 mg Oral Q breakfast  . methylPREDNISolone  16 mg Oral Q1400   Followed by  . methylPREDNISolone  8 mg Oral Q1400   Followed by  . methylPREDNISolone  4 mg Oral Q1400  . methylPREDNISolone  16 mg Oral QHS   Followed by  . methylPREDNISolone  8 mg Oral QHS   Followed by  . methylPREDNISolone  4 mg Oral QHS  . methylPREDNISolone (SOLU-MEDROL) injection  48 mg Intravenous Once  . ondansetron (ZOFRAN) IV  8 mg Intravenous Q8H  . pantoprazole (PROTONIX) IV  40 mg Intravenous QHS  . prenatal multivitamin  1 tablet Oral Daily  . scopolamine  1 patch Transdermal Q72H  . DISCONTD: ondansetron (ZOFRAN) IV  4 mg Intravenous Q8H    Ht: 5\' 6"  (167.6 cm)  Wt: 112 lb 4 oz (50.916 kg)  Ideal Wt: 59.3 kg% Ideal Wt: 93%  Usual Wt: 121 Lbs/55  kg % Usual Wt: 93%  Body mass index is 18.12 kg/(m^2). Prepregnancy BMI 19.6  Food/Nutrition Related Hx: Reports 11 week Hx of n/v. Vomits immediately after eating. As well as when stomach empty. Unable to tolerate any beverages except kool-ade and sprite. Foods with ginger not tolerated. Has been trying to consume small frequent meals. Labs:  CMP     Component Value Date/Time   NA 134* 10/08/2012 2000   K 3.1* 10/08/2012 2000   CL 99 10/08/2012 2000   CO2 24 10/08/2012 2000   GLUCOSE 99 10/08/2012 2000   BUN 4* 10/08/2012 2000   CREATININE 0.38* 10/08/2012 2000   CALCIUM 8.9 10/08/2012 2000   PROT 5.7* 10/08/2012 2000   ALBUMIN 3.0* 10/08/2012 2000   AST 15 10/08/2012 2000   ALT 13 10/08/2012 2000   ALKPHOS 51 10/08/2012 2000   BILITOT 0.2* 10/08/2012 2000   GFRNONAA >90 10/08/2012 2000   GFRAA >90 10/08/2012 2000   Intake:   Intake/Output Summary (Last 24 hours) at 10/09/12 1227 Last data filed at 10/09/12 0845  Gross per 24 hour  Intake 2342.73 ml  Output    500 ml  Net 1842.73 ml   Diet Order: Antenatal Regular.    Pt able to consume and keep down  portions of breakfast and lunch. Provided snack menu so she could order snacks TID.  Is aware of the significance of the weight loss. Has > 5 % loss of usual weight putting her and infants at nutritional risk, mild - moderate degree of malnutrition.  Understands that tube feedings are an option for the future, has no questions about tube feedings and at this time is not open to the idea. Wants to go home.   IVF:    dextrose 5% lactated ringers Last Rate: 125 mL/hr at 10/09/12 0911  lactated ringers     Estimated Nutritional Needs: Increased needs due to twin pregnancy. D5LR providing 510 Kcal/day   Kcal: 1900-2100 Protein: 70- 80 g Fluid: 2.3 L  NUTRITION DIAGNOSIS: Inadequate oral intake r/t hyperemesis aeb weight loss, n/v  MONITORING/EVALUATION(Goals): Improved po intake, allowing to meet 90 % estimated needs  twin pregnancy  EDUCATION NEEDS: -No education needs identified at this time  DOCUMENTATION CODES Per approved criteria  -Non-severe (moderate) malnutrition in the context of acute illness or injury  Per loss of 7% of usual weight in < 3 months and intake < 75% estimated needs  For > 7 days  Elisabeth Cara M.Odis Luster LDN Neonatal Nutrition Support Specialist Pager [phone]  10/09/2012, 12:18 PM

## 2012-10-09 NOTE — Progress Notes (Signed)
UR Chart review completed.  

## 2012-10-09 NOTE — Discharge Instructions (Signed)
Hyperemesis Gravidarum Hyperemesis gravidarum is a severe form of nausea and vomiting that happens during pregnancy. Hyperemesis is worse than morning sickness. It may cause a woman to have nausea or vomiting all day for many days. It may keep a woman from eating and drinking enough food and liquids. Hyperemesis usually occurs during the first half (the first 20 weeks) of pregnancy. It often goes away once a woman is in her second half of pregnancy. However, sometimes hyperemesis continues through an entire pregnancy.  CAUSES  The cause of this condition is not completely known but is thought to be due to changes in the body's hormones when pregnant. It could be the high level of the pregnancy hormone or an increase in estrogen in the body.  SYMPTOMS   Severe nausea and vomiting.  Nausea that does not go away.  Vomiting that does not allow you to keep any food down.  Weight loss and body fluid loss (dehydration).  Having no desire to eat or not liking food you have previously enjoyed. DIAGNOSIS  Your caregiver may ask you about your symptoms. Your caregiver may also order blood tests and urine tests to make sure something else is not causing the problem.  TREATMENT  You may only need medicine to control the problem. If medicines do not control the nausea and vomiting, you will be treated in the hospital to prevent dehydration, acidosis, weight loss, and changes in the electrolytes in your body that may harm the unborn baby (fetus). You may need intravenous (IV) fluids.  HOME CARE INSTRUCTIONS   Take all medicine as directed by your caregiver.  Try eating a couple of dry crackers or toast in the morning before getting out of bed.  Avoid foods and smells that upset your stomach.  Avoid fatty and spicy foods. Eat 5 to 6 small meals a day.  Do not drink when eating meals. Drink between meals.  For snacks, eat high protein foods, such as cheese. Eat or suck on things that have ginger in  them. Ginger helps nausea.  Avoid food preparation. The smell of food can spoil your appetite.  Avoid iron pills and iron in your multivitamins until after 3 to 4 months of being pregnant. SEEK MEDICAL CARE IF:   Your abdominal pain increases since the last time you saw your caregiver.  You have a severe headache.  You develop vision problems.  You feel you are losing weight. SEEK IMMEDIATE MEDICAL CARE IF:   You are unable to keep fluids down.  You vomit blood.  You have constant nausea and vomiting.  You have a fever.  You have excessive weakness, dizziness, fainting, or extreme thirst. MAKE SURE YOU:   Understand these instructions.  Will watch your condition.  Will get help right away if you are not doing well or get worse. Document Released: 12/09/2005 Document Revised: 03/02/2012 Document Reviewed: 03/11/2011 [employer] Patient Information 2013 [employer].  

## 2012-10-21 ENCOUNTER — Encounter: Payer: Self-pay | Admitting: Obstetrics and Gynecology

## 2012-10-21 ENCOUNTER — Other Ambulatory Visit: Payer: Self-pay | Admitting: Obstetrics and Gynecology

## 2012-10-21 ENCOUNTER — Ambulatory Visit (INDEPENDENT_AMBULATORY_CARE_PROVIDER_SITE_OTHER): Payer: Medicaid Other

## 2012-10-21 ENCOUNTER — Ambulatory Visit (INDEPENDENT_AMBULATORY_CARE_PROVIDER_SITE_OTHER): Payer: Medicaid Other | Admitting: Obstetrics and Gynecology

## 2012-10-21 VITALS — BP 98/62 | Wt 112.0 lb

## 2012-10-21 DIAGNOSIS — O30009 Twin pregnancy, unspecified number of placenta and unspecified number of amniotic sacs, unspecified trimester: Secondary | ICD-10-CM

## 2012-10-21 DIAGNOSIS — O30002 Twin pregnancy, unspecified number of placenta and unspecified number of amniotic sacs, second trimester: Secondary | ICD-10-CM

## 2012-10-21 DIAGNOSIS — Z3689 Encounter for other specified antenatal screening: Secondary | ICD-10-CM

## 2012-10-21 NOTE — Progress Notes (Signed)
[redacted]w[redacted]d  Ultrasound shows:  SIUP  S=D     [place] EDD: 09/20/2013            AFI: N/A           Cervical length: 2.64 cm           Placenta localization: [name] = Anterior 6.2 cm from internal OS, [name] = Anterior Placenta 6.8 cm from internal OS           Fetal presentation: [name] = vertex left, [name] = Breech maternal right                   Anatomy survey is normal           Gender : female

## 2012-10-21 NOTE — Progress Notes (Signed)
[redacted]w[redacted]d Ultrasound: twin pregnancy concordant with dates          Vertex/breech           Anatomy normal but cord insertion not well seen on Baby B Hyperemesis: just completed Medrol protocol and using Protonix, no vomiting in 2 weeks and 3 lbs weight gain

## 2012-10-22 LAB — US OB TRANSVAGINAL

## 2012-10-22 LAB — US OB COMP ADDL GEST + 14 WK

## 2012-11-18 ENCOUNTER — Ambulatory Visit (INDEPENDENT_AMBULATORY_CARE_PROVIDER_SITE_OTHER): Payer: Medicaid Other | Admitting: Obstetrics and Gynecology

## 2012-11-18 ENCOUNTER — Encounter: Payer: Self-pay | Admitting: Obstetrics and Gynecology

## 2012-11-18 VITALS — BP 98/50 | Wt 122.0 lb

## 2012-11-18 DIAGNOSIS — Z331 Pregnant state, incidental: Secondary | ICD-10-CM

## 2012-11-18 DIAGNOSIS — O3440 Maternal care for other abnormalities of cervix, unspecified trimester: Secondary | ICD-10-CM

## 2012-11-18 NOTE — Progress Notes (Signed)
[redacted]w[redacted]d Pap done today Col[facility] Daily Reccommended  Ultrasound at [facility] for cervical check

## 2012-11-18 NOTE — Progress Notes (Signed)
[redacted]w[redacted]d  Pt has no complaints

## 2012-11-23 LAB — PAP IG, CT-NG, RFX HPV ASCU
Chlamydia Probe Amp: NEGATIVE
GC Probe Amp: NEGATIVE

## 2012-12-02 ENCOUNTER — Other Ambulatory Visit: Payer: Self-pay | Admitting: Obstetrics and Gynecology

## 2012-12-02 ENCOUNTER — Encounter: Payer: Medicaid Other | Admitting: Obstetrics and Gynecology

## 2012-12-02 ENCOUNTER — Ambulatory Visit (INDEPENDENT_AMBULATORY_CARE_PROVIDER_SITE_OTHER): Payer: Medicaid Other | Admitting: Obstetrics and Gynecology

## 2012-12-02 ENCOUNTER — Ambulatory Visit (INDEPENDENT_AMBULATORY_CARE_PROVIDER_SITE_OTHER): Payer: Medicaid Other

## 2012-12-02 ENCOUNTER — Other Ambulatory Visit: Payer: Medicaid Other

## 2012-12-02 VITALS — BP 98/58 | Wt 126.0 lb

## 2012-12-02 DIAGNOSIS — O3440 Maternal care for other abnormalities of cervix, unspecified trimester: Secondary | ICD-10-CM

## 2012-12-02 DIAGNOSIS — Z9889 Other specified postprocedural states: Secondary | ICD-10-CM

## 2012-12-02 DIAGNOSIS — IMO0001 Reserved for inherently not codable concepts without codable children: Secondary | ICD-10-CM

## 2012-12-02 DIAGNOSIS — O30009 Twin pregnancy, unspecified number of placenta and unspecified number of amniotic sacs, unspecified trimester: Secondary | ICD-10-CM

## 2012-12-02 NOTE — Progress Notes (Signed)
[redacted]w[redacted]d Ultrasound: dichorionic and diamniotic Twins, baby A is breech, 1 lb. 8 oz. (32nd percentile), normal fluid.  Baby B is vertex, 1 lb. 7 oz. (23rd percentile), normal fluid.  Cervix is 2.08 cm and dynamic. Doing reasonably well.  Complains of swelling in her legs.  Exam within normal limits.  One plus edema.  Support stockings recommended. Patient was to return in 2 weeks. Repeat ultrasound next visit. Dr. Stefano Gaul

## 2012-12-02 NOTE — Progress Notes (Signed)
Pt stated no issues today.  

## 2012-12-04 LAB — US OB TRANSVAGINAL

## 2012-12-04 LAB — US OB FOLLOW UP

## 2012-12-24 ENCOUNTER — Ambulatory Visit (INDEPENDENT_AMBULATORY_CARE_PROVIDER_SITE_OTHER): Payer: Medicaid Other | Admitting: Obstetrics and Gynecology

## 2012-12-24 ENCOUNTER — Ambulatory Visit (INDEPENDENT_AMBULATORY_CARE_PROVIDER_SITE_OTHER): Payer: Medicaid Other

## 2012-12-24 VITALS — BP 92/54 | Wt 131.0 lb

## 2012-12-24 DIAGNOSIS — O26879 Cervical shortening, unspecified trimester: Secondary | ICD-10-CM

## 2012-12-24 DIAGNOSIS — O30009 Twin pregnancy, unspecified number of placenta and unspecified number of amniotic sacs, unspecified trimester: Secondary | ICD-10-CM

## 2012-12-24 DIAGNOSIS — Z331 Pregnant state, incidental: Secondary | ICD-10-CM

## 2012-12-24 DIAGNOSIS — O3440 Maternal care for other abnormalities of cervix, unspecified trimester: Secondary | ICD-10-CM

## 2012-12-24 DIAGNOSIS — IMO0001 Reserved for inherently not codable concepts without codable children: Secondary | ICD-10-CM

## 2012-12-24 LAB — FETAL FIBRONECTIN: Fetal Fibronectin: NEGATIVE

## 2012-12-24 NOTE — Progress Notes (Signed)
[redacted]w[redacted]d  pt desires cervix check.

## 2012-12-24 NOTE — Progress Notes (Signed)
[redacted]w[redacted]d Ultrasound: cervix 0.88 cm.  Dynamic. [name]: Frank breech, normal fluid. 2 pounds and 7 ounces (33rd percentile). [name]: Transverse, normal fluid. 2 pounds and 6 ounces (29th percentile). Preterm labor discussed.  Short cervix discussed.  The patient will begin progesterone vaginal suppositories 100 mg daily.  She will limit her activity. Gonorrhea, chlamydia, and beta strep, and fetal fibronectin obtained.  Cervix is closed, 50% effaced, and -3 by exam. The patient was offered hospitalization.  Betamethasone treatment was discussed. The patient will be called with her test results. The patient wants to have her tubes tied.  Sterilization was discussed. Returned office in one week if all of her testing remains normal. Repeat ultrasound next week to measure cervix. Dr. Stefano Gaul

## 2012-12-25 ENCOUNTER — Other Ambulatory Visit: Payer: Self-pay

## 2012-12-25 ENCOUNTER — Other Ambulatory Visit: Payer: Self-pay | Admitting: Obstetrics and Gynecology

## 2012-12-25 ENCOUNTER — Telehealth: Payer: Self-pay

## 2012-12-25 DIAGNOSIS — O3440 Maternal care for other abnormalities of cervix, unspecified trimester: Secondary | ICD-10-CM

## 2012-12-25 DIAGNOSIS — O26879 Cervical shortening, unspecified trimester: Secondary | ICD-10-CM

## 2012-12-25 LAB — US OB TRANSVAGINAL

## 2012-12-25 LAB — US OB FOLLOW UP

## 2012-12-25 LAB — GC/CHLAMYDIA PROBE AMP
CT Probe RNA: NEGATIVE
GC Probe RNA: NEGATIVE

## 2012-12-25 MED ORDER — PROGESTERONE 100 MG VA SUPP: 100.0000 mg | Freq: Once | VAGINAL | Status: DC

## 2012-12-25 NOTE — Telephone Encounter (Signed)
Tc to pt per negative FFN results. Pt voices understanding.  

## 2012-12-26 ENCOUNTER — Inpatient Hospital Stay (HOSPITAL_COMMUNITY)
Admission: AD | Admit: 2012-12-26 | Discharge: 2012-12-26 | Disposition: A | Discharge status: Home / Self Care | Payer: Medicaid Other | Source: Ambulatory Visit | Attending: Obstetrics and Gynecology | Admitting: Obstetrics and Gynecology

## 2012-12-26 DIAGNOSIS — O4700 False labor before 37 completed weeks of gestation, unspecified trimester: Secondary | ICD-10-CM | POA: Insufficient documentation

## 2012-12-26 DIAGNOSIS — O30009 Twin pregnancy, unspecified number of placenta and unspecified number of amniotic sacs, unspecified trimester: Secondary | ICD-10-CM | POA: Insufficient documentation

## 2012-12-26 LAB — STREP B DNA PROBE: GBSP: POSITIVE

## 2012-12-26 MED ORDER — BETAMETHASONE SOD PHOS & ACET 6 (3-3) MG/ML IJ SUSP
12.0000 mg | Freq: Once | INTRAMUSCULAR | Status: AC
  Administered 2012-12-26: 12 mg via INTRAMUSCULAR
  Filled 2012-12-26: qty 2

## 2012-12-27 ENCOUNTER — Inpatient Hospital Stay (HOSPITAL_COMMUNITY)
Admission: AD | Admit: 2012-12-27 | Discharge: 2012-12-27 | Disposition: A | Discharge status: Home / Self Care | Payer: Medicaid Other | Source: Ambulatory Visit | Attending: Obstetrics and Gynecology | Admitting: Obstetrics and Gynecology

## 2012-12-27 DIAGNOSIS — O4700 False labor before 37 completed weeks of gestation, unspecified trimester: Secondary | ICD-10-CM | POA: Insufficient documentation

## 2012-12-27 DIAGNOSIS — O26879 Cervical shortening, unspecified trimester: Secondary | ICD-10-CM

## 2012-12-27 DIAGNOSIS — O30009 Twin pregnancy, unspecified number of placenta and unspecified number of amniotic sacs, unspecified trimester: Secondary | ICD-10-CM | POA: Insufficient documentation

## 2012-12-27 MED ORDER — BETAMETHASONE SOD PHOS & ACET 6 (3-3) MG/ML IJ SUSP
12.0000 mg | Freq: Once | INTRAMUSCULAR | Status: AC
  Administered 2012-12-27: 12 mg via INTRAMUSCULAR
  Filled 2012-12-27: qty 2

## 2012-12-27 NOTE — MAU Note (Signed)
Pt here for 2nd betamethasone injection.

## 2012-12-31 ENCOUNTER — Ambulatory Visit (INDEPENDENT_AMBULATORY_CARE_PROVIDER_SITE_OTHER): Payer: Medicaid Other | Admitting: Obstetrics and Gynecology

## 2012-12-31 ENCOUNTER — Ambulatory Visit (INDEPENDENT_AMBULATORY_CARE_PROVIDER_SITE_OTHER): Payer: Medicaid Other

## 2012-12-31 ENCOUNTER — Telehealth: Payer: Self-pay | Admitting: Advanced Practice Midwife

## 2012-12-31 VITALS — BP 90/60 | Wt 129.0 lb

## 2012-12-31 DIAGNOSIS — IMO0001 Reserved for inherently not codable concepts without codable children: Secondary | ICD-10-CM

## 2012-12-31 DIAGNOSIS — O26879 Cervical shortening, unspecified trimester: Secondary | ICD-10-CM

## 2012-12-31 DIAGNOSIS — O30009 Twin pregnancy, unspecified number of placenta and unspecified number of amniotic sacs, unspecified trimester: Secondary | ICD-10-CM

## 2012-12-31 DIAGNOSIS — K649 Unspecified hemorrhoids: Secondary | ICD-10-CM

## 2012-12-31 MED ORDER — PROGESTERONE MICRONIZED 200 MG PO CAPS: ORAL_CAPSULE | ORAL | Status: DC

## 2012-12-31 MED ORDER — HYDROCORTISONE 2.5 % RE CREA: TOPICAL_CREAM | Freq: Two times a day (BID) | RECTAL | Status: DC

## 2012-12-31 NOTE — Progress Notes (Addendum)
[redacted]w[redacted]d Ultrasound: [name] breech, normal fluid. [name] transverse, normal fluid. Cervix 0.87 cm Patient reports contracting.  Contractions every 6 min. On NST with uterine irritability. Patient has received betamethasone. To the hospital for observation.  May need a fetal fibronectin. Dr. Stefano Gaul  Addendum:  Medicaid will not pay for progesterone vaginal suppositories.  Evidence supports progesterone replacement in twins with a short cervix.  We will prescribe Prometrium 200 mg tablets per vagina each day.  The patient complains of painful hemorrhoids.  We will prescribe Proctor cream HC to use as needed.  Dr. Stefano Gaul December 31, 2012 9:00 pm

## 2012-12-31 NOTE — Addendum Note (Signed)
Addended by: Janine Limbo on: 12/31/2012 09:05 PM   Modules accepted: Orders

## 2012-12-31 NOTE — Telephone Encounter (Signed)
Pt seen in office today. Dx short cervix, contracting. Instructed to come to MAU. Had not arrived as 2113. VM left asking pt to come to MAU.

## 2013-01-01 ENCOUNTER — Encounter (HOSPITAL_COMMUNITY): Payer: Self-pay

## 2013-01-01 ENCOUNTER — Inpatient Hospital Stay (HOSPITAL_COMMUNITY)
Admission: AD | Admit: 2013-01-01 | Discharge: 2013-01-12 | DRG: 781 | Disposition: A | Discharge status: Home / Self Care | Payer: Medicaid Other | Source: Ambulatory Visit | Attending: Obstetrics and Gynecology | Admitting: Obstetrics and Gynecology

## 2013-01-01 DIAGNOSIS — Z9889 Other specified postprocedural states: Secondary | ICD-10-CM | POA: Diagnosis present

## 2013-01-01 DIAGNOSIS — O4700 False labor before 37 completed weeks of gestation, unspecified trimester: Secondary | ICD-10-CM

## 2013-01-01 DIAGNOSIS — Z23 Encounter for immunization: Secondary | ICD-10-CM

## 2013-01-01 DIAGNOSIS — O228X9 Other venous complications in pregnancy, unspecified trimester: Secondary | ICD-10-CM | POA: Diagnosis present

## 2013-01-01 DIAGNOSIS — Z22330 Carrier of Group B streptococcus: Secondary | ICD-10-CM

## 2013-01-01 DIAGNOSIS — D649 Anemia, unspecified: Secondary | ICD-10-CM | POA: Diagnosis present

## 2013-01-01 DIAGNOSIS — O212 Late vomiting of pregnancy: Secondary | ICD-10-CM | POA: Diagnosis present

## 2013-01-01 DIAGNOSIS — O26879 Cervical shortening, unspecified trimester: Principal | ICD-10-CM | POA: Diagnosis present

## 2013-01-01 DIAGNOSIS — O30049 Twin pregnancy, dichorionic/diamniotic, unspecified trimester: Secondary | ICD-10-CM | POA: Diagnosis present

## 2013-01-01 DIAGNOSIS — O99019 Anemia complicating pregnancy, unspecified trimester: Secondary | ICD-10-CM | POA: Diagnosis present

## 2013-01-01 DIAGNOSIS — K649 Unspecified hemorrhoids: Secondary | ICD-10-CM

## 2013-01-01 DIAGNOSIS — O30009 Twin pregnancy, unspecified number of placenta and unspecified number of amniotic sacs, unspecified trimester: Secondary | ICD-10-CM

## 2013-01-01 DIAGNOSIS — O210 Mild hyperemesis gravidarum: Secondary | ICD-10-CM | POA: Diagnosis present

## 2013-01-01 DIAGNOSIS — O3090 Multiple gestation, unspecified, unspecified trimester: Secondary | ICD-10-CM | POA: Diagnosis present

## 2013-01-01 DIAGNOSIS — N883 Incompetence of cervix uteri: Secondary | ICD-10-CM

## 2013-01-01 DIAGNOSIS — IMO0001 Reserved for inherently not codable concepts without codable children: Secondary | ICD-10-CM

## 2013-01-01 DIAGNOSIS — O3440 Maternal care for other abnormalities of cervix, unspecified trimester: Secondary | ICD-10-CM | POA: Diagnosis present

## 2013-01-01 DIAGNOSIS — O09899 Supervision of other high risk pregnancies, unspecified trimester: Secondary | ICD-10-CM

## 2013-01-01 DIAGNOSIS — O99891 Other specified diseases and conditions complicating pregnancy: Secondary | ICD-10-CM | POA: Diagnosis present

## 2013-01-01 LAB — FETAL FIBRONECTIN: Fetal Fibronectin: POSITIVE — AB

## 2013-01-01 MED ORDER — CALCIUM CARBONATE ANTACID 500 MG PO CHEW: 2.0000 | CHEWABLE_TABLET | ORAL | Status: DC | PRN

## 2013-01-01 MED ORDER — ZOLPIDEM TARTRATE 5 MG PO TABS: 5.0000 mg | ORAL_TABLET | Freq: Every evening | ORAL | Status: DC | PRN

## 2013-01-01 MED ORDER — LIDOCAINE HCL 2 % EX GEL
Freq: Once | CUTANEOUS | Status: AC
  Administered 2013-01-01: 5 via TOPICAL

## 2013-01-01 MED ORDER — HYDROCORTISONE 2.5 % RE CREA
TOPICAL_CREAM | Freq: Two times a day (BID) | RECTAL | Status: DC
  Administered 2013-01-01 – 2013-01-12 (×12): via RECTAL
  Filled 2013-01-01: qty 28.35

## 2013-01-01 MED ORDER — DOCUSATE SODIUM 100 MG PO CAPS
100.0000 mg | ORAL_CAPSULE | Freq: Every day | ORAL | Status: DC
  Filled 2013-01-01 (×5): qty 1

## 2013-01-01 MED ORDER — PROGESTERONE MICRONIZED 200 MG PO CAPS
400.0000 mg | ORAL_CAPSULE | Freq: Every day | ORAL | Status: DC
  Administered 2013-01-01: 400 mg via VAGINAL
  Filled 2013-01-01: qty 2

## 2013-01-01 MED ORDER — PRENATAL MULTIVITAMIN CH
1.0000 | ORAL_TABLET | Freq: Every day | ORAL | Status: DC
  Administered 2013-01-04: 1 via ORAL
  Filled 2013-01-01 (×3): qty 1

## 2013-01-01 MED ORDER — ACETAMINOPHEN 325 MG PO TABS: 650.0000 mg | ORAL_TABLET | ORAL | Status: DC | PRN

## 2013-01-01 MED ORDER — ONDANSETRON 8 MG PO TBDP
8.0000 mg | ORAL_TABLET | Freq: Three times a day (TID) | ORAL | Status: DC | PRN
  Filled 2013-01-01: qty 1

## 2013-01-01 MED ORDER — NIFEDIPINE 10 MG PO CAPS
10.0000 mg | ORAL_CAPSULE | Freq: Four times a day (QID) | ORAL | Status: DC | PRN
  Administered 2013-01-05: 10 mg via ORAL
  Filled 2013-01-01 (×2): qty 1

## 2013-01-01 NOTE — Consult Note (Signed)
Maternity Admissions Unit History and Physical Exam for an Obstetrics Patient  [name] is a 22 y.o. female, G2P1001, at [redacted]w[redacted]d gestation, who presents for evaluation of preterm labor. She has been followed at the [facility] and Gynecology division of [facility].  Her pregnancy has been complicated by twins. Her cervix has been short as measured by ultrasound and exam. She had a negative fetal fibronectin one week ago. Her beta strep test was positive. Gonorrhea and Chlamydia were negative. Her cervix was noted to be 1 cm dilated one week ago. She has received 2 doses of betamethasone. She was seen in the office on 12/31/2012. She was noted to have contractions on the monitor. She was asked to come to the [facility] hospital [facility] for evaluation. She was concerned that she will lose her job she did not go to work. She presents today. She reports that her contractions are more frequent today than yesterday. The patient has a history of CIN-2. She has had a LEEP procedure. See history below.  An ultrasound showed that her twins are in a footling breech/transverse position.  The patient complains of painful hemorrhoids.  OB History    Grav Para Term Preterm Abortions TAB SAB Ect Mult Living   2 1 1       1       Past Medical History  Diagnosis Date  . S/P LEEP   . Smoker   . H/O varicella   . Abnormal Pap smear 04/2008    LEEP  . Hx: UTI (urinary tract infection)   . GBS carrier   . First trimester bleeding 06/07/2009  . H/O sciatica   . Marijuana use     History of use  . BV (bacterial vaginosis) 2009  . CIN II (cervical intraepithelial neoplasia II) 2009  . Periurethral tear in female 07/04/09  . Labial tear 07/04/09  . H/O: menorrhagia 2011    Prescriptions prior to admission  Medication Sig Dispense Refill  . ondansetron (ZOFRAN-ODT) 8 MG disintegrating tablet Take 1 tablet (8 mg total) by mouth every 8 (eight) hours as needed. For  nausea  20 tablet  0  . hydrocortisone (ANUSOL-HC) 2.5 % rectal cream Place rectally 2 (two) times daily.  30 g  3    Past Surgical History  Procedure Date  . Wisdom tooth extraction   . Dilation and curettage of uterus   . Leep     No Known Allergies  Family History: family history includes Cancer in her maternal grandfather and Diabetes in her brother.  Social History:  reports that she has been smoking Cigarettes.  She has been smoking about .5 packs per day. She has never used smokeless tobacco. She reports that she does not drink alcohol or use illicit drugs.  Review of systems: Normal pregnancy complaints.  Admission Physical Exam:  Dilation: 2.5 (2-3cm per dr Stefano Gaul) Effacement (%): 40 Exam by:: Dr Stefano Gaul Body mass index is 21.05 kg/(m^2).  Height 5\' 6"  (1.676 m), weight 130 lb 6.4 oz (59.149 kg), last menstrual period 06/13/2012.  HEENT:                 Within normal limits Chest:                   Clear Heart:                    Regular rate and rhythm Abdomen:  Gravid and nontender Extremities:          Grossly normal Neurologic exam: Grossly normal Pelvic exam:         Cervix: 3 cm, 40% effaced, ballotable  Prenatal labs: ABO, Rh:             A/POS/-- (09/06 1545) Antibody:              NEG (09/06 1545) Rubella:                  RPR:                    NON REAC (09/06 1545)  HBsAg:                 NEGATIVE (09/06 1545)  HIV:                       NON REACTIVE (09/06 1545)  GBS:                     POSITIVE (01/02 1715)  NST: Category 1x2; Contractions: Mild and infrequent .   Assessment:  [redacted]w[redacted]d gestation  Twin gestation  Footling breech/ transverse presentation  Preterm cervical shortening  Preterm cervical dilatation  Uterine contractions  Positive beta strep  Hemorrhoid pain  Cigarette smoker  Noncompliance  Plan:  Fetal fibronectin sent.  Management options were reviewed. The patient understands our concerns for  preterm delivery. She understands that a cesarean section will be required because of the presentation of her infants. She understands our concerns for rupture membranes with a dilated cervix. If the fetal fibronectin is negative, then I feel more comfortable discharging the patient home. She can take Procardia 10 mg every 2-4 hours for irregular contractions. I have encouraged her to spend more time resting.  If the fetal fibronectin is positive, then I recommend that she remain in the hospital for observation. Again, Procardia can be given for contractions.  Vaginal progesterone is recommended. Medicaid will not cover a compounded medication and the patient has not started her vaginal progesterone for that reason. We will try micronized progesterone (Prometrium 400 mg vaginally each day) (which Medicaid will pay for).  Lidocaine jelly to hemorrhoids.   Janine Limbo 01/01/2013, 8:33 PM

## 2013-01-01 NOTE — MAU Note (Signed)
Patient is in stating that she was asked to come to MAU to monitored for contractions. She states that she does not feel contractions, at times have tightening in her abdomen. She denies vaginal bleeding or lof. Reports good fetal movements.

## 2013-01-02 DIAGNOSIS — O4700 False labor before 37 completed weeks of gestation, unspecified trimester: Secondary | ICD-10-CM

## 2013-01-02 DIAGNOSIS — O30009 Twin pregnancy, unspecified number of placenta and unspecified number of amniotic sacs, unspecified trimester: Secondary | ICD-10-CM

## 2013-01-02 LAB — AMNISURE RUPTURE OF MEMBRANE (ROM) NOT AT ARMC: Amnisure ROM: NEGATIVE

## 2013-01-02 MED ORDER — PROGESTERONE MICRONIZED 200 MG PO CAPS
200.0000 mg | ORAL_CAPSULE | Freq: Every day | ORAL | Status: DC
  Administered 2013-01-02 – 2013-01-11 (×10): 200 mg via VAGINAL
  Filled 2013-01-02 (×10): qty 1

## 2013-01-02 NOTE — Progress Notes (Addendum)
Patient ID: [name], female   DOB: 09/20/1991, 22 y.o.   MRN: [id number]  Hospital day # 1 pregnancy at [redacted]w[redacted]d  S:  States she wants to go home.  States she does not do well with decreased activity and closed quarters - "I just need to breath some fresh air out of this room".  Denies chest pain, SOB, abd pain, leg pain       well, reports good fetal activity      Contractions:none-denies at present.      Vaginal bleeding:None       Vaginal discharge: watery-states she has had 2 gushes of large amt of clear watery discharge from vagina when up to BR and placing anusol suppository.  1st occurred at approx 0800 today and again "few hrs later".    O: BP 102/60  Pulse 97  Temp 98.3 F (36.8 C) (Oral)  Resp 20  Ht 5\' 6"  (1.676 m)  Wt 130 lb 6.4 oz (59.149 kg)  BMI 21.05 kg/m2  LMP 06/13/2012       Filed Vitals:   01/02/13 0355 01/02/13 0800 01/02/13 0819 01/02/13 1155  BP: 105/48  97/50 102/60  Pulse: 87  83 97  Temp: 98.5 F (36.9 C)  98.2 F (36.8 C) 98.3 F (36.8 C)  TempSrc: Oral  Oral Oral  Resp: 20 20 18 20   Height:      Weight:           Heart:  RRR      Lungs:  CTA bilat      Abd:  Soft, pos BS x 4 quads.       Fetal tracings:reviewed and reassuring [name] baseline 130bpm with mod variability and accels present, no decels, Cat 1; [name] baseline 140bpm with mod variability and accels present, no decels, Cat 1.        Toco: Uterine irritability with rare contraction traced since admission      Uterus gravid, soft and non-tender      Extremities: Neg edema, neg Homans bilat.  No evidence of DVT bilat.      Perineum: Dry without evidence of gross ROM.  SVE deferred at present.  A: [redacted]w[redacted]d with Twins/Positive FFN/Shortened Cx      [name] footling breech presentation     ? SROM  P: Amnisure collected and will await results.     Will update Dr. Pennie Rushing.      Pt desires wheelchair ride outside.    SMITH,NONA O., CNM 01/02/2013 1:56 PM      1)Amnisure is  negative 2)I had a long discussion with the patient concerning her high risk for preterm delivery with the following features: Twin gestation.                preterm cervical dilatation                history of LEEP                cigarette smoking                positive fetal fibronectin 3)I have discussed the risk factors associated with a footling breech presentation, which include the risk of preterm premature rupture of membranes with cord prolapse and how the outcome would be suboptimal if the patient had this event outside the hospital. She continues to want to go home, but seems to understand the value of the hospitalized. She is asked to be able to visit with  her daughter in spite of the hospital restrictions on children visitors. A visit between the 2 ends women's hospital. Education area may be possible 4) the patient continues to express desire for surgical sterilization at the time of a cesarean section.Patient desires permanent sterilization.  Other reversible forms of contraception were discussed with patient; she declines all other modalities. Risks of procedure discussed with patient including but not limited to: risk of regret, permanence of method, bleeding, infection, injury to surrounding organs and need for additional procedures.  Failure risk of 0.5-1% with increased risk of ectopic gestation if pregnancy occurs was also discussed with patient.  Patient verbalized understanding of these risks and wants to proceed with sterilization.Marland Kitchen

## 2013-01-02 NOTE — Progress Notes (Signed)
amniosure is neg.

## 2013-01-02 NOTE — H&P (Signed)
Admission H&P   (see note below by Dr Stefano Gaul)   [name] is a 22 y.o. female, G2P1001, at [redacted]w[redacted]d gestation, who presents for evaluation of preterm labor. She has been followed at the [facility] and Gynecology division of [facility]. Her pregnancy has been complicated by twins. Her cervix has been short as measured by ultrasound and exam. She had a negative fetal fibronectin one week ago. Her beta strep test was positive. Gonorrhea and Chlamydia were negative. Her cervix was noted to be 1 cm dilated one week ago. She has received 2 doses of betamethasone. She was seen in the office on 12/31/2012. She was noted to have contractions on the monitor. She was asked to come to the [facility] hospital [facility] for evaluation. She was concerned that she will lose her job she did not go to work. She presents today. She reports that her contractions are more frequent today than yesterday. The patient has a history of CIN-2. She has had a LEEP procedure. See history below.  An ultrasound showed that her twins are in a footling breech/transverse position.  The patient complains of painful hemorrhoids.  OB History    Grav  Para  Term  Preterm  Abortions  TAB  SAB  Ect  Mult  Living    2  1  1        1       Past Medical History   Diagnosis  Date   .  S/P LEEP    .  Smoker    .  H/O varicella    .  Abnormal Pap smear  04/2008     LEEP   .  Hx: UTI (urinary tract infection)    .  GBS carrier    .  First trimester bleeding  06/07/2009   .  H/O sciatica    .  Marijuana use      History of use   .  BV (bacterial vaginosis)  2009   .  CIN II (cervical intraepithelial neoplasia II)  2009   .  Periurethral tear in female  07/04/09   .  Labial tear  07/04/09   .  H/O: menorrhagia  2011    Prescriptions prior to admission   Medication  Sig  Dispense  Refill   .  ondansetron (ZOFRAN-ODT) 8 MG disintegrating tablet  Take 1 tablet (8 mg total) by mouth every 8  (eight) hours as needed. For nausea  20 tablet  0   .  hydrocortisone (ANUSOL-HC) 2.5 % rectal cream  Place rectally 2 (two) times daily.  30 g  3    Past Surgical History   Procedure  Date   .  Wisdom tooth extraction    .  Dilation and curettage of uterus    .  Leep     No Known Allergies  Family History: family history includes Cancer in her maternal grandfather and Diabetes in her brother.  Social History: reports that she has been smoking Cigarettes. She has been smoking about .5 packs per day. She has never used smokeless tobacco. She reports that she does not drink alcohol or use illicit drugs.  Review of systems: Normal pregnancy complaints.  Admission Physical Exam:  Dilation: 2.5 (2-3cm per dr Stefano Gaul)  Effacement (%): 40  Exam by:: Dr Stefano Gaul Body mass index is 21.05 kg/(m^2).  Height 5\' 6"  (1.676 m), weight 130 lb 6.4 oz (59.149 kg), last menstrual period 06/13/2012.  HEENT:  Within normal limits  Chest: Clear  Heart: Regular rate and rhythm  Abdomen: Gravid and nontender  Extremities: Grossly normal  Neurologic exam: Grossly normal  Pelvic exam: Cervix: 3 cm, 40% effaced, ballotable  Prenatal labs:  ABO, Rh: A/POS/-- (09/06 1545)  Antibody: NEG (09/06 1545)  Rubella:  RPR: NON REAC (09/06 1545)  HBsAg: NEGATIVE (09/06 1545)  HIV: NON REACTIVE (09/06 1545)  GBS: POSITIVE (01/02 1715)  NST: Category 1x2; Contractions: Mild and infrequent    Assessment:  IUP di/di twins at [redacted]w[redacted]d in footling/transversion position  +FFN Preterm cervical dilation S/p BMZ course GBS pos FHR reassuring x2  Admit to antepartum per c/w Dr Stefano Gaul Routine antenatal orders CEFM Bedrest w BRP Vaginal progesterone 400mg  at HS

## 2013-01-02 NOTE — Progress Notes (Signed)
1350- pt c/o gush of fluid from her vagina. amniosure done by [employer] cnm and sent to lab.

## 2013-01-02 NOTE — Clinical SW OB High Risk (Signed)
Clinical Social Work Department ANTENATAL PSYCHOSOCIAL ASSESSMENT 01/02/2013  Patient:  ALA, KRATZ   Account Number:  [id number]  Admit Date:  01/01/2013     DOB:  [birth year]   Age:  22 Gestational age on admission:  29     Expected delivery date:  03/20/2013 Admitting diagnosis:   PTL    Clinical Social Worker:  Lulu Riding,  [place]  Date/Time:  01/02/2013 11:00 AM  FAMILY/HOME ENVIRONMENT  Home address:   [address],  [place], [place] [address]   Household Member/Support Name Relationship Age  Ulice Dash Spouse   Macie OTHER 11  [name] DAUGHTER 3   Other support:   Note: Glean Salvo is FOB's daughter, of whom he has full custody.     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      Cultural/Environmental issues impacting care:   None identified    STRENGTHS / WEAKNESSES / FACTORS TO CONSIDER  Concerns related to hospitalization:   Patient is highly concerned that she will have to stay in the hospital and not be able to see her 93 year old daughter.  She is also concerned that she will not be able to handle a long hospitalization mentally and emotionally, however she is realistic about the safety concerns if she were to go home.  She mainly wishes the doctor would come talk to her soon so she knows what the decision is.  CSW suggested she prepare herself for having to stay in the hospital long term, but explained that it is not forever and that she would not want to jeopardize her safety or her babies' safety and that she has already stated that her 23 year old is safe with FOB's sister.  Patient is also concerned  about the health of her twins, especially if they were born within the next few days/weeks.  CSW discussed experience working in the NICU and what to expect from a NICU admission in general terms.   Previous pregnancies/feelings towards pregnancy?  Concerns related to being/becoming a mother?   Patient is happy about the pregnancy, although states this will be here last and that BTL papers have already been signed.  She compared this pregnancy with her first and how complicated this one has been and how perfectly smooth her first was.   Social support (FOB? Who is/will be helping with baby/other kids)   She states FOB is very supportive and hardly ever sleeps because he works third shift and then comes home to watch their daughter so MOB can go to work.  She states his sister is also very supportive, amoung other people.  She states his sister is caring for their children while patient is in the hopital.   Couples relationship:   She reports having a good relationship with FOB.   Recent stressful life events (life changes in past year?):   None stated   Prenatal care/education/home preparations?   Did not discuss at this time, but patient has received regular PNC.   Domestic violence (of any type):  N If yes to domestic violence  describe/action plan:  Substance use during pregnancy.  (If YES, complete SBIRT):  N  Complete PHQ-9 (Depression Screening) on all antenatal patients.  PHQ-9 score:    (IF SCORE => 15 complete TREAT)  Follow up recommendations:   CSW recommends patient trust her doctors and abide by their medical decisions.  She agrees.  CSW recommends a Neonatology consult and spoke to RN about this.   Patient advised/response?   Patient is very pleasant and states she will need to be able to get some fresh air and leave her room from time to time if she is instructed to remain in the hospital.  She states she will absolutely have to be able to see her daughter also while she is  a patient here.   Other:    Clinical Assessment/Plan CSW thinks patient is appropriately concerned about her babies and about being away from her 33 year old.  She seems to be willing to remain in the hospital if that is what is decided, but wants to know her daughter will be able to visit.  CSW explained the visitation restriction policy currently in effect due to the flu season, but states that making arrangements for her to spend time with her daughter can be discussed.  CSW made no promises to patient, but recommends being allowed to see her daughter once a day for her sake and her daughter's sake.  CSW also recommended to patient that she discuss the possibility of wheel chair rides with her doctor.  She is in agreement at this time.

## 2013-01-03 ENCOUNTER — Encounter (HOSPITAL_COMMUNITY): Payer: Self-pay

## 2013-01-03 DIAGNOSIS — O321XX0 Maternal care for breech presentation, not applicable or unspecified: Secondary | ICD-10-CM

## 2013-01-03 LAB — CBC
HCT: 27.8 % — ABNORMAL LOW (ref 36.0–46.0)
Hemoglobin: 9.5 g/dL — ABNORMAL LOW (ref 12.0–15.0)
MCH: 31.7 pg (ref 26.0–34.0)
MCHC: 34.2 g/dL (ref 30.0–36.0)
MCV: 92.7 fL (ref 78.0–100.0)
Platelets: 294 10*3/uL (ref 150–400)
RBC: 3 MIL/uL — ABNORMAL LOW (ref 3.87–5.11)
RDW: 13.7 % (ref 11.5–15.5)
WBC: 18.8 10*3/uL — ABNORMAL HIGH (ref 4.0–10.5)

## 2013-01-03 NOTE — Progress Notes (Signed)
Patient ID: Tricia Wallace, female   DOB: June 05, 1991, 22 y.o.   MRN: [id number] Tricia Wallace is a 22 y.o. G2P1001 with  A twin gestationt [redacted]w[redacted]d by ultrasound admitted for Preterm labor  Subjective: GI: negative GU: Complains of increased pelvic pressure. OB:  Good fetal movement.  No significant increase in contractions        Objective: BP 99/49  Pulse 79  Temp 98.5 F (36.9 C) (Oral)  Resp 20  Ht 5\' 6"  (1.676 m)  Wt 130 lb 6.4 oz (59.149 kg)  BMI 21.05 kg/m2  LMP 06/13/2012      FHT:  FHR: 140-150 bpm, variability: moderate,  accelerations:  Present,  decelerations:  Absent UC:   3-4 per hour with uterine irritability  SVE:   Dilation: 2.5 (2-3cm per dr Stefano Gaul) Effacement (%): 40 Exam by:: Dr Stefano Gaul  Labs: Lab Results  Component Value Date   WBC 7.8 10/08/2012   HGB 10.2* 10/08/2012   HCT 28.7* 10/08/2012   MCV 86.4 10/08/2012   PLT 215 10/08/2012    Assessment  Preterm labor with twin gestation at 81 weeks Malpresentation footling breech of [name].  group B strep positive Anemia Fetal Wellbeing:  Category I  / Plan: Continue bed rest in hospital DVT prophylaxis Iron therapy Patient understands that C-section would be needed delivery.  . Patient desires permanent sterilization.  Other reversible forms of contraception were discussed with patient; she declines all other modalities. Risks of procedure discussed with patient including but not limited to: risk of regret, permanence of method, bleeding, infection, injury to surrounding organs and need for additional procedures. These are the same risks that are present. For cesarean section.  The additional risks of Failure risk of 0.5-1% with increased risk of ectopic gestation if pregnancy occurs was also discussed with patient.  Patient verbalized understanding of these risks and wants to proceed with sterilization.  Written informed consent obtained.  Tricia Wallace    Tanashia Ciesla P 01/03/2013, 8:21 AM

## 2013-01-03 NOTE — Progress Notes (Addendum)
Patient ID: Tricia Wallace, female   DOB: 04/27/1991, 22 y.o.   MRN: [id number] COMPREHENSIVE FOLLOWUP NOTE  Tricia Wallace is a 22 y.o. G2P1001 at [redacted]w[redacted]d who is admitted for Preterm labor.   Fetal presentation is breech and transverse. Length of Stay:  2  Days  Subjective: Slight increase in low abd pressure. Hemorrhoid pain and spotting. Reports depression R/Y bed rest and separtion from 23 year-old daughter Patient reports the fetal movement as active. Patient reports uterine contraction  activity as Irreg, ~8 over night. None in past hour.  Patient reports  vaginal bleeding as none. Patient describes fluid per vagina as None.  Vitals:  Blood pressure 93/52, pulse 90, temperature 98.6 F (37 C), temperature source Oral, resp. rate 18, height 5\' 6"  (1.676 m), weight 130 lb 6.4 oz (59.149 kg), last menstrual period 06/13/2012. Physical Examination:  General appearance - alert, well appearing, oriented to person, place, and time, anxious and crying. Heart - normal rate and regular rhythm Abdomen - soft, nontender, nondistended Fundal Height:  size greater than dates Cervical Exam: Not evaluated. Extremities: extremities normal, atraumatic, no cyanosis or edema and Homans sign is negative, no sign of DVT with DTRs 2+ bilaterally Membranes:intact Perineum. No VB. Scant spotting on tucks pad placed over hemorrhoids. One hemorrhoid purple, soft, scant bleeding.   Fetal Monitoring:  Baseline: 120 x 2 bpm, Variability: Good {> 6 bpm), Accelerations: Reactive and Decelerations: Absent Toco: UI  Labs:  Recent Results (from the past 24 hour(s))  CBC   Collection Time   01/03/13 10:30 AM      Component Value Range   WBC 18.8 (*) 4.0 - 10.5 K/uL   RBC 3.00 (*) 3.87 - 5.11 MIL/uL   Hemoglobin 9.5 (*) 12.0 - 15.0 g/dL   HCT 78.2 (*) 95.6 - 21.3 %   MCV 92.7  78.0 - 100.0 fL   MCH 31.7  26.0 - 34.0 pg   MCHC 34.2  30.0 - 36.0 g/dL   RDW 08.6  57.8 - 46.9 %   Platelets 294  150 - 400 K/uL    TYPE AND SCREEN   Collection Time   01/03/13 11:00 AM      Component Value Range   ABO/RH(D) A POS     Antibody Screen NEG     Sample Expiration 01/06/2013      Imaging Studies:  NONE   Medications:  Scheduled    . docusate sodium  100 mg Oral Daily  . hydrocortisone   Rectal BID  . prenatal multivitamin  1 tablet Oral Daily  . progesterone  200 mg Vaginal QHS    ASSESSMENT: Patient Active Problem List  Diagnosis  . Twin dichorionic diamniotic placenta  . History of cervical LEEP biopsy affecting care of mother, antepartum   Desires sterilization  . Hyperemesis complicating pregnancy, antepartum  . Short cervix affecting pregnancy    Positive FFN Malpresentation [name]:  Footling breech Desires sterilization  PLAN: Support given. Chaplain consult. Try to arrange visit for daughter Encourage flu vaccine Per consult w/ Dr. Pennie Rushing will change to HST QS, cont toco.  30 day papers for tubal sterilization signed Tricia Wallace P 01/03/2013,4:18 PM

## 2013-01-03 NOTE — Consult Note (Signed)
Neonatology Consult to Antenatal Patient:  Ms. Memory Argue was admitted on 1/10, now at 29 1/[redacted] weeks GA. She is currently not having active labor. She smokes 1/2 pack/day cigarettes.She has received a 2-dose course of BMZ about 2 weeks ago. She is admitted in preterm labor with a short cervix and 2 cm dilatation, positive FFN. Babies are footling breech and transverse presentation. They are concordant and both female. Mother is GBS pos.  I spoke with the patient alone. We discussed the worst case of delivery in the next 1-2 days, including usual DR management, possible respiratory complications and need for support, IV access, feedings (mother desires breast feeding, which was encouraged), LOS, Mortality and Morbidity, and long term outcomes. She had a few questions at this time, which I answered. I offered a NICU tour to any interested family members and would be glad to come back if she has more questions..  Thank you for asking me to see this patient.  Deatra James, MD Neonatologist  Time spent: 25 minutes

## 2013-01-04 ENCOUNTER — Inpatient Hospital Stay (HOSPITAL_COMMUNITY): Payer: Medicaid Other

## 2013-01-04 LAB — GLUCOSE TOLERANCE, 1 HOUR: Glucose, 1 Hour GTT: 117 mg/dL (ref 70–140)

## 2013-01-04 NOTE — Consult Note (Signed)
Maternal Fetal Medicine Consultation  Requesting Provider(s): Silverio Lay, MD  Reason for consultation: DC/DA twin gestation at 29 2/7 weeks, shortened cervix with + fetal fibronectin, [name] - incomplete breech presentation  HPI: Tricia Wallace is a 22 yo G2P1001 currently at 33 2/7 weeks with DC/DA twins admitted due to shortened cervix.  Admission cervical exam was 2-3 cm/ 40% effaced.  Fetal fibronectin was positive.  She completed a course of betamethasone approximately 2 weeks ago and is currently receiving transvaginal progesterone supplementation.  Of note, [name] was footing breech presentation at the time of last ultrasound.  Clinic ultrasound from 12/24/2012 showed a cervical length of < 1 cm with U-shaped funneling.  On last ultrasound exam, fetal growth is appropriate and concordant ([name] 1099 g / 33rd %tile; [name] 1080 g / 29th %tile).  She is without complaints today.  She denies vaginal bleeding, leakage of fluid or uterine contractions.  OB History: OB History    Grav Para Term Preterm Abortions TAB SAB Ect Mult Living   2 1 1       1       PMH:  Past Medical History  Diagnosis Date  . S/P LEEP   . Smoker   . H/O varicella   . Abnormal Pap smear 04/2008    LEEP  . Hx: UTI (urinary tract infection)   . GBS carrier   . First trimester bleeding 06/07/2009  . H/O sciatica   . Marijuana use     History of use  . BV (bacterial vaginosis) 2009  . CIN II (cervical intraepithelial neoplasia II) 2009  . Periurethral tear in female 07/04/09  . Labial tear 07/04/09  . H/O: menorrhagia 2011    PSH:  Past Surgical History  Procedure Date  . Wisdom tooth extraction   . Dilation and curettage of uterus   . Leep    Meds:  Scheduled Meds:   . docusate sodium  100 mg Oral Daily  . hydrocortisone   Rectal BID  . prenatal multivitamin  1 tablet Oral Daily  . progesterone  200 mg Vaginal QHS   Continuous Infusions:  PRN Meds:.acetaminophen, calcium carbonate,  NIFEdipine, ondansetron, zolpidem  Allergies: NKDA  FH: family history includes Cancer in her maternal grandfather and Diabetes in her brother. Denies family history of birth defects or hereditary disorders  Soc: reports that she has been smoking Cigarettes. She has been smoking about .5 packs per day (none since admission). She has never used smokeless tobacco. She reports that she does not drink alcohol or use illicit drugs.   Review of Systems: no vaginal bleeding or cramping/contractions, no LOF, no nausea/vomiting. All other systems reviewed and are negative.  PNL:  ABO, Rh: A/POS/-- (09/06 1545)  Antibody: NEG (09/06 1545)  Rubella:  RPR: NON REAC (09/06 1545)  HBsAg: NEGATIVE (09/06 1545)  HIV: NON REACTIVE (09/06 1545)  GBS: POSITIVE (01/02 1715)  NST: Category 1x2; Contractions: Mild and infrequent   PE:   Filed Vitals:   01/04/13 1205  BP:   Pulse:   Temp:   Resp: 18    GEN: well-appearing female ABD: gravid, NT  Please see separate document for fetal ultrasound report.  Labs: CBC    Component Value Date/Time   WBC 18.8* 01/03/2013 1030   RBC 3.00* 01/03/2013 1030   HGB 9.5* 01/03/2013 1030   HCT 27.8* 01/03/2013 1030   PLT 294 01/03/2013 1030   MCV 92.7 01/03/2013 1030   MCH 31.7 01/03/2013  1030   MCHC 34.2 01/03/2013 1030   RDW 13.7 01/03/2013 1030   LYMPHSABS 1.8 10/08/2012 2000   MONOABS 0.4 10/08/2012 2000   EOSABS 0.0 10/08/2012 2000   BASOSABS 0.0 10/08/2012 2000     A/P: 1) DC/DA twin gestation at 29 2/7 weeks         2) Shortened / dilated cervix with positive fetal fibronectin - s/p betamethasone, currently on vagina progesterone         3) [name] - footling breech presentation         4) Likely iron deficiency anemia         5) Smoking history  Recommendations: 1) Recommend continued inpatient bedrest / observation.  Concur that with [name] in an incomplete breech presentation, this is an increased risk for cord prolapse should PROM or labor  to occur.   2) In the event that any significant clinical change were to occur (i.e. Frequent uterine contractions, PPROM , significant cervical change) would recommend a 12-hr course of Magnesium sulfate for neuro prophylaxis and GBS prophylaxis if delivery is not imminent.  If the patient were to go into active labor (cervical exam > 4-5 cm with frequent uterine contractions) would move toward Cesarean section if [name] remains in a breech presentation. 3) Will schedule follow up ultrasound with MFM later this week.  Would recommend weekly limited ultrasounds to check fetal presentation.  If [name] is in a more favorable presentation (either complete breech or cephalic) would entertain hospital discharge if the patient has otherwise remained stable. 4) If [name] remains in an incomplete breech presentation, would entertain Cesarean section at 36-37 weeks, but feel that this is unlikely given her current clinical situation.   Thank you for the opportunity to be a part of the care of [name]. Please contact our office if we can be of further assistance.   I spent approximately 30 minutes with this patient with over 50% of time spent in face-to-face counseling.  Alpha Gula, MD Maternal Fetal Medicine

## 2013-01-04 NOTE — Progress Notes (Signed)
Hospital day # 3 pregnancy at [redacted]w[redacted]d di-di twin pregnancy with PTL  S: well, reports good fetal activity      Contractions:rare      Vaginal bleeding:none now       Vaginal discharge: no significant change  O: BP 105/64  Pulse 99  Temp 97.9 F (36.6 C) (Oral)  Resp 18  Ht 5\' 6"  (1.676 m)  Wt 130 lb 6.4 oz (59.149 kg)  BMI 21.05 kg/m2  LMP 06/13/2012      Fetal tracings:reviewed and reassuring      Uterus gravid and non-tender      Extremities: no significant edema and no signs of DVT  A: [redacted]w[redacted]d with PT, preterm cervical change and footling breech     stable  P: continue current plan of care with inpatient bed rest until delivery (reviewed with Dr Claudean Severance MFM)      Continue Prometrium vaginally HS      Plan ultrasound with MFM this week  Johnathan Heskett A  MD 01/04/2013 12:30 PM

## 2013-01-04 NOTE — Progress Notes (Signed)
I received a consult to see Tricia Wallace.  She was feeling down and admitted to feeling irritated because she feels very helpless to be away from her 22 year old daughter.  She has never been away from her before and her daughter is not coping well with the separation.  She is also nervous about how she will divide herself between three children.   I provided compassionate listening and gave her space to share her fears and anger.  I also helped her to think through other ways to connect with her daughter while she is here.  [employer] Pager, [phone] 12:13 PM   01/04/13 1200  Clinical Encounter Type  Visited With Patient  Visit Type Spiritual support  Spiritual Encounters  Spiritual Needs Emotional

## 2013-01-05 ENCOUNTER — Inpatient Hospital Stay (HOSPITAL_COMMUNITY): Payer: Medicaid Other

## 2013-01-05 LAB — PREPARE RBC (CROSSMATCH)

## 2013-01-05 LAB — US OB LIMITED

## 2013-01-05 MED ORDER — NIFEDIPINE 10 MG PO CAPS
10.0000 mg | ORAL_CAPSULE | Freq: Four times a day (QID) | ORAL | Status: DC
  Administered 2013-01-05 (×3): 10 mg via ORAL
  Filled 2013-01-05 (×4): qty 1

## 2013-01-05 MED ORDER — NIFEDIPINE 10 MG PO CAPS
10.0000 mg | ORAL_CAPSULE | Freq: Once | ORAL | Status: AC
  Administered 2013-01-05: 10 mg via ORAL

## 2013-01-05 NOTE — Progress Notes (Signed)
RN at the bedside for 30 min to obtain fhr tracing

## 2013-01-05 NOTE — Progress Notes (Signed)
In to see patient--she has been very depressed today and desiring to go home, since her daughter can't come into hospital to see her due to flu visitation restrictions.   Reviewed today's US findings with patient and husband, and showed them pictures of cervical funneling on today's [place]. Patient was told "cervix was closed" by sonographer, and this confused her, based on previous evaluation of cervix dilation on admission.  Is focused on previous discussion with [facility] MD--"if I get to 5 cm, they will deliver me".  Aware of sporadic contractions, feels pressure particularly in left lower groin area.  Declines pain medication.  [place]:   [name], 2+12, 35%ile, footling breech [name] 2+5, 17%ile, transverse Cervical length 0.4, funnel length 2.9, funnel width 1.2, funneling of internal os noted.  Reviewed medical concerns of footling breech, dilated cervix, and persistence of contractions, with risk of progression of labor, SROM with cord prolapse, and premature delivery.  Support to patient for long-term hospitalization issues--she feels if she could see her daughter, she would feel better. Husband will bring daughter to hospital education entrance tomorrow, and patient will be transported to see her at the doorway--patient feel this will help her psyche and mood.  Dr. Stefano Gaul updated on status.  Nigel Bridgeman, CNM 01/05/13 7:30p

## 2013-01-05 NOTE — Progress Notes (Signed)
01/05/13 1230  Clinical Encounter Type  Visited With Patient  Visit Type Spiritual support;Social support;Follow-up  Referral From Chaplain  Spiritual Encounters  Spiritual Needs Emotional  Stress Factors  Patient Stress Factors Loss of control;Major life changes (Struggling with/angry about separation from dtr (3) at home.)    [name] described herself as "very, very, very depressed" about being separated from her three-year-old daughter Cammy Copa because of her stay in [facility] and the flu-protection visitation policy that prohibits children's visits.  She was very appreciative of chaplains' visits and opportunity to vent about her feelings with neutral parties outside of her family.  She also reports having good support--indeed, "maybe too much support"; the calls and inquiries about how Cammy Copa is doing are feeling overwhelming and exacerbates pt's own stress, per pt.  Provided safe space for emotional processing and continued reframing to help Willene find a way to interpret her situation that can make it (staying in the hospital) feel sustainable.    Spiritual Care will continue to follow for support.  Please page [phone] as additional care needed or desired, including in case of emergency c-section.  (As space and appropriateness allow, a chaplain would be glad to support pt and family through any stage of that process.)  Thank you!  [address] [place], [place] [phone]

## 2013-01-05 NOTE — Progress Notes (Signed)
Shawnell N Nance  was seen today for an ultrasound appointment.  See full report in [facility].  DC/DA twin gestation with best dates of 29 3/7 weeks 16 % growth discordance noted  [name]: footling breech presentation, maternal left, anterior placenta               No fetal anomalies noted.  Some views of the anatomy were limited due to gestational age and fetal position               Normal amniotic fluid               Estimated fetal weight at 35th %tile  [name]: transverse presentation wth fetal head to maternal left               No fetal anomalies noted.  Again, some views of the anatomy were limited due to gestaional age and fetal position               Normal amniotic fluid volume               Estimated fetal weight at 17th %tile.  The AC measures at the 6th %tile.  TVUS: Cervical length approx 4 mm.  V-shaped funneling almost the full length of the cervix  Recommend weekly limited ultrasounds- to check [name]'s presentation.  If either complete breech or cephalic, would feel more comfortable discharging patient home on modified bedrest if otherwise stable. Serial growth scans q 3-4 weeks while hospitalized.  Alpha Gula, MD

## 2013-01-05 NOTE — Progress Notes (Signed)
22 y.o. year old female,at [redacted]w[redacted]d gestation. The patient has preterm labor with twins. The presenting twin is a footling breech. Her fetal fibronectin is positive.  SUBJECTIVE:  The patient is "down" because she needs to be hospitalized.  OBJECTIVE:  BP 98/44  Pulse 91  Temp 98.3 F (36.8 C) (Oral)  Resp 20  Ht 5\' 6"  (1.676 m)  Wt 130 lb 6.4 oz (59.149 kg)  BMI 21.05 kg/m2  LMP 06/13/2012  Fetal Heart Tones:  Stable fetal heart rate x2. Accelerations noted.  Contractions:          More contractions this morning. The contractions are mild and every 6-10 minutes. Procardia has been given.  Abdomen: Gravid, nontender Extremities: No masses or cords. No signs of DVT  Glucola screen is 117.  ASSESSMENT:  [redacted]w[redacted]d Weeks Pregnancy  Twin gestation  Preterm labor  Positive fetal fibronectin  Footling breech presentation  PLAN:  The patient understands that she will be hospitalized until she delivers, or until the fetal position for [name] is more favorable.  She will continue vaginal progesterone.  Continue Procardia to manage preterm contractions.  Leonard Schwartz, M.D.

## 2013-01-06 ENCOUNTER — Encounter: Payer: Medicaid Other | Admitting: Obstetrics and Gynecology

## 2013-01-06 ENCOUNTER — Other Ambulatory Visit: Payer: Medicaid Other

## 2013-01-06 DIAGNOSIS — O09899 Supervision of other high risk pregnancies, unspecified trimester: Secondary | ICD-10-CM

## 2013-01-06 HISTORY — DX: Supervision of other high risk pregnancies, unspecified trimester: O09.899

## 2013-01-06 MED ORDER — LACTATED RINGERS IV BOLUS (SEPSIS)
250.0000 mL | Freq: Once | INTRAVENOUS | Status: AC
  Administered 2013-01-06: 250 mL via INTRAVENOUS

## 2013-01-06 MED ORDER — LACTATED RINGERS IV SOLN
INTRAVENOUS | Status: DC
  Administered 2013-01-06: 11:00:00 via INTRAVENOUS

## 2013-01-06 NOTE — Progress Notes (Signed)
Pt without complaints.  No leakage of fluid or VB.  Good FM  BP 94/47  Pulse 86  Temp 98.2 F (36.8 C) (Oral)  Resp 18  Ht 5\' 6"  (1.676 m)  Wt 124 lb 9.6 oz (56.518 kg)  BMI 20.11 kg/m2  LMP 06/13/2012  FHTS Baseline: 130 bpm and baby B 140 good LTV  Toco regular, every 3-5 minutes  Pt in NAD CV RRR Lungs CTAB abd  Gravid soft and NT GU no vb 2-3 /50/ ballotable EXt no calf tenderness Results for orders placed during the hospital encounter of 01/01/13 (from the past 72 hour(s))  CBC     Status: Abnormal   Collection Time   01/03/13 10:30 AM      Component Value Range Comment   WBC 18.8 (*) 4.0 - 10.5 K/uL    RBC 3.00 (*) 3.87 - 5.11 MIL/uL    Hemoglobin 9.5 (*) 12.0 - 15.0 g/dL    HCT 40.9 (*) 81.1 - 46.0 %    MCV 92.7  78.0 - 100.0 fL    MCH 31.7  26.0 - 34.0 pg    MCHC 34.2  30.0 - 36.0 g/dL    RDW 91.4  78.2 - 95.6 %    Platelets 294  150 - 400 K/uL   TYPE AND SCREEN     Status: Normal (Preliminary result)   Collection Time   01/03/13 11:00 AM      Component Value Range Comment   ABO/RH(D) A POS      Antibody Screen NEG      Sample Expiration 01/06/2013      Unit Number [id number]      Blood Component Type RED CELLS,LR      Unit division 00      Status of Unit ALLOCATED      Transfusion Status OK TO TRANSFUSE      Crossmatch Result Compatible      Unit Number [id number]      Blood Component Type RED CELLS,LR      Unit division 00      Status of Unit ALLOCATED      Transfusion Status OK TO TRANSFUSE      Crossmatch Result Compatible     GLUCOSE TOLERANCE, 1 HOUR     Status: Normal   Collection Time   01/04/13  6:30 PM      Component Value Range Comment   Glucose, 1 Hour GTT 117  70 - 140 mg/dL   PREPARE RBC (CROSSMATCH)     Status: Normal   Collection Time   01/05/13  6:30 AM      Component Value Range Comment   Order Confirmation ORDER PROCESSED BY BLOOD BANK       Assessment and Plan [redacted]w[redacted]d  PTL twins Pt received beta methasone D/c  procardia he BP is low and pt is symptomatic IVF given If pt labors, plan cesarean

## 2013-01-07 ENCOUNTER — Inpatient Hospital Stay (HOSPITAL_COMMUNITY): Payer: Medicaid Other

## 2013-01-07 LAB — TYPE AND SCREEN
ABO/RH(D): A POS
ABO/RH(D): A POS
Antibody Screen: NEGATIVE
Antibody Screen: NEGATIVE
Unit division: 0
Unit division: 0

## 2013-01-07 NOTE — Progress Notes (Signed)
Antenatal Nutrition Assessment:  Currently  29 5/[redacted] weeks gestation, with PTL/twins. Height  66" Weight 124 Lbs pre-pregnancy weight 121 Lbs.Pre-pregnancy  BMI 19.6  IBW 130 Lbs  Total weight gain 3 Lbs. Weight gain goals 37-54 Lbs.   Estimated needs: 21-2300 kcal/day, 71-81 grams protein/day, 2 liters fluid/day  Regular diet tolerated well, appetite good per pt report. Pt has Hx of hyperemesis this pregnancy. She reports that she has not had nausea since last hospital admission for hyperemesis. Encouraged po intake and changed diet order to antenatal regular to allow snacks TID. Offered ensure, which pt did not want to consume. Unable to tolerate milk.  Current diet prescription will provide for increased needs.  No abnormal nutrition related labs  Nutrition Dx: Increased nutrient needs r/t pregnancy and fetal growth requirements aeb 29 week twin gestation.  No educational needs assessed at this time.  Elisabeth Cara M.Odis Luster LDN Neonatal Nutrition Support Specialist Pager [phone]

## 2013-01-07 NOTE — Progress Notes (Signed)
01/07/13 1555  Clinical Encounter Type  Visited With Patient  Visit Type Follow-up;Spiritual support;Social support  Spiritual Encounters  Spiritual Needs Emotional    Followed up with Tricia Wallace today for >40 minutes.  She is feeling more resigned to staying here in the hospital and is working to frame self-talk that will help her cope with facing weeks of antenatal time.  Learning that she may visit with her three-year-old daughter Tricia Wallace daily for 30 minutes was also helpful.  In the face of having so little control, any way that she can make a plan, reduce anxiety, increase comfort, and know what to expect is helpful for her.  To that end, Tricia Wallace is planning to write up a birth plan today to name her preferences, such as whom to contact when she goes to the OR (husband Josh only) and the fact that she prefers not to have family gather at hospital while she is in recovery, not to have family visit babies before she sees them, etc.  Tricia Wallace has also expressed desire to tour the NICU so that she can envision where the babies will be and have more of a sense of what to expect, both of which, she anticipates, will help reduce her anxiety and help her feel more prepared.  I will continue to follow for spiritual and emotional support and have encouraged Tricia Wallace to think about whether there are areas in which may wish to include chaplain care in her birth/recovery/early NICU plan.  [address] [place], [place] [phone]

## 2013-01-07 NOTE — Progress Notes (Signed)
Patient ID: [name], female   DOB: 1991-09-08, 22 y.o.   MRN: [id number] PREZLEY QADIR is a 22 y.o. G2P1001 at [redacted]w[redacted]d by LMP admitted for Preterm labor  Subjective: GI: negative GU: Denies: dysuria, frequency/urgency, vaginal bleeding, pelvic pain OB: Good fetal movement        Objective: BP 108/55  Pulse 97  Temp 98.5 F (36.9 C) (Oral)  Resp 16  Ht 5\' 6"  (1.676 m)  Wt 124 lb 9.6 oz (56.518 kg)  BMI 20.11 kg/m2  LMP 06/13/2012      FHT:  FHR: 130-140 bpm, variability: moderate,  accelerations:  Present,  decelerations:  Present occassional variables.  Same interpretation for babies A&B. UC:   rare SVE:   Dilation: 2.5 Effacement (%): 50 Station: Ballotable Exam by:: Dr. Normand Sloop  Labs: Lab Results  Component Value Date   WBC 18.8* 01/03/2013   HGB 9.5* 01/03/2013   HCT 27.8* 01/03/2013   MCV 92.7 01/03/2013   PLT 294 01/03/2013    Assessment / Plan: Twin gestation at 29 weeks and 5 days Preterm labor Malpresentation of [name]. Desire for surgical sterilization Anemia  Fetal Wellbeing:  Category I Continue current care with weekly ultrasounds for presentation   Jovante Hammitt P 01/07/2013, 10:42 AM

## 2013-01-08 NOTE — Progress Notes (Addendum)
Hospital day # 7 pregnancy at [redacted]w[redacted]d--Twins, PTL, cervical dilation, footling breech [name].  S:  Doing well, reports good fetal activity.  Feels more emotionally settled than previously.  Daughter comes to [facility] with her dad so that patient can see her every 1-2 days, which has made patient feel better about long-term hospitalization.      Perception of contractions: Feels occasional tightening, but pelvic pressure less than earlier this week.      Vaginal bleeding: None       Vaginal discharge:  None  O: BP 100/65  Pulse 88  Temp 98 F (36.7 C) (Oral)  Resp 16  Ht 5\' 6"  (1.676 m)  Wt 124 lb 9.6 oz (56.518 kg)  BMI 20.11 kg/m2  LMP 06/13/2012      Fetal tracings: Reassuring for both babies.      Contractions:   Occasional, some irritability      Uterus non-tender      Extremities: no significant edema and no signs of DVT          Labs: NA       Meds:  Prometrium 200 mg per vagina q hs.  A: [redacted]w[redacted]d with twins, PTL, footling breech [name]     stable  P: Continue current plan of care      Upcoming tests/treatments:  Weekly [place] for position (next due 1/21)      Support to patient for long-term hospitalization issues.      MDs will follow  Nigel Bridgeman CNM, [place] 01/08/2013 8:28 AM  Agree with above (pt down seeing daughter)- AYR

## 2013-01-09 MED ORDER — COMPLETENATE 29-1 MG PO CHEW
1.0000 | CHEWABLE_TABLET | Freq: Every day | ORAL | Status: DC
  Administered 2013-01-09 – 2013-01-12 (×4): 1 via ORAL
  Filled 2013-01-09 (×4): qty 1

## 2013-01-09 NOTE — Progress Notes (Signed)
Hospital day # 8 pregnancy at [redacted]w[redacted]d--Twins, PTL.  S:  Doing well, reports good fetal activity.  Very pleased with each day the babies remain in utero.      Perception of contractions: Had more contractions during night, now resolved.  Reports sat up to work on school paper yesterday, with increased groin pressure since then.      Vaginal bleeding: None       Vaginal discharge:  no significant change  O: BP 99/62  Pulse 86  Temp 98.2 F (36.8 C) (Oral)  Resp 20  Ht 5\' 6"  (1.676 m)  Wt 124 lb 9.6 oz (56.518 kg)  BMI 20.11 kg/m2  LMP 06/13/2012      Fetal tracings:  Reactive x 2      Contractions:   Mild irritability now--early in shift, had 5-6 UCs in an hour, but slept through them.  Resolved after voiding.      Uterus non-tender      Extremities: no significant edema and no signs of DVT          Labs:  NA       Meds: Prometrium 200 mg per vagina q hs  A: [redacted]w[redacted]d with twins, PTL     stable  P: Continue current plan of care      Upcoming tests/treatments:  Korea for position on 01/12/13      MDs will follow  Tricia Wallace CNM, [place] 01/09/2013 10:10 AM

## 2013-01-10 LAB — TYPE AND SCREEN
ABO/RH(D): A POS
Antibody Screen: NEGATIVE

## 2013-01-10 LAB — PREPARE RBC (CROSSMATCH)

## 2013-01-10 NOTE — Progress Notes (Signed)
Hospital Day No: 9  Subjective: No complaints.  Had some pressure last night but it resolved.  Denies ctxs, lof or vb.  She feels the babies moving well.  There is a slight increase in her discharge she says but it is unchanged in any other way.  She no longer takes the procardia because she said it dropped her BP low and she became symptomatic.  Objective: BP 103/58  Pulse 98  Temp 98.2 F (36.8 C) (Oral)  Resp 20  Ht 5\' 6"  (1.676 m)  Wt 124 lb 9.6 oz (56.518 kg)  BMI 20.11 kg/m2  LMP 06/13/2012      Physical Exam:  Gen: alert and no distress Chest/Lungs: cta bilaterally  Heart/Pulse: RRR  Abdomen: soft, gravid, nontender, BX x4 quad Uterine fundus: soft, gravid, nontender EXT: negative Homan's b/l, edema neg, no s/sxs of DVT  FHT:  FHR: 120s [name] and 130s [name] bpm, variability: moderate,  accelerations:  Present,  decelerations:  Absent UC:   occas runs of irritability and ctxs SVE:   Dilation: 2.5 Effacement (%): 50 Station: Ballotable Exam by:: Dr. Normand Sloop  Labs: Lab Results  Component Value Date   WBC 18.8* 01/03/2013   HGB 9.5* 01/03/2013   HCT 27.8* 01/03/2013   MCV 92.7 01/03/2013   PLT 294 01/03/2013    Assessment and Plan: P 1 at 30 1/7wks admitted in PTL with advanced cervical dilation.  Cont current mgmt.  U/s on Tues 21st to recheck presentation.  Fetal status x 2 is overall reassuring.  Will order SCDs to bilateral LEs.   Purcell Nails 01/10/2013, 12:14 PM

## 2013-01-11 NOTE — Progress Notes (Signed)
Hospital day # 10 pregnancy at [redacted]w[redacted]d  S: well, reports good fetal activity X 2      Contractions:more overnight but not now      Vaginal bleeding:none now       Vaginal discharge: no significant change  O: BP 103/54  Pulse 89  Temp 98.1 F (36.7 C) (Oral)  Resp 18  Ht 5\' 6"  (1.676 m)  Wt 124 lb 9.6 oz (56.518 kg)  BMI 20.11 kg/m2  SpO2 98%  LMP 06/13/2012      Fetal tracings:reviewed and reassuring x 2       Uterus gravid and non-tender      Extremities: no significant edema and no signs of DVT  A: [redacted]w[redacted]d with di-di twins and PTL     stable  P: continue current plan of care     Ultrasound tomorrow for presentation  [facility] A  MD 01/11/2013 11:43 AM

## 2013-01-11 NOTE — Progress Notes (Signed)
01/11/13 1500  Clinical Encounter Type  Visited With Patient  Visit Type Follow-up;Spiritual support;Social support  Spiritual Encounters  Spiritual Needs Emotional  Stress Factors  Patient Stress Factors (Anxious abt ultrasound 1/20 due to hx pain/ctx afterward.)    Tricia Wallace was emotionally grounded and in good spirits during this follow-up visit.  Hitting 30 weeks, getting used to hospital environment, and coming to terms with risk factors have all helped her make this transition away from urgent desire to leave.  She looks forward to seeing dtr Tricia Wallace this pm, though saying goodbye to her again is sad and painful.  She reports good connection and love from husband Tricia Wallace.    Please page chaplain whenever support needed (casual visit, accompaniment for pt/husband in OR, support through pain/contractions that ultrasound may stir up, etc).  Thank you!  [address] [place], [place] [phone]

## 2013-01-11 NOTE — Progress Notes (Signed)
Ur chart review completed.  

## 2013-01-12 ENCOUNTER — Inpatient Hospital Stay (HOSPITAL_COMMUNITY): Payer: Medicaid Other

## 2013-01-12 MED ORDER — DSS 100 MG PO CAPS: 100.0000 mg | ORAL_CAPSULE | Freq: Every day | ORAL | Status: DC

## 2013-01-12 MED ORDER — PROGESTERONE MICRONIZED 200 MG PO CAPS: 200.0000 mg | ORAL_CAPSULE | Freq: Every day | ORAL | Status: DC

## 2013-01-12 NOTE — Progress Notes (Signed)
Dr Normand Sloop in room. Will plan on discharge this afternoon.

## 2013-01-12 NOTE — Progress Notes (Signed)
Patient ID: Tricia Wallace, female   DOB: 10-03-1991, 22 y.o.   MRN: [id number]  Obstetric Discharge Summary Reason for Admission: PTL Prenatal Procedures: NST and ultrasound Intrapartum Procedures: none Postpartum Procedures: none Complications-Operative and Postpartum: none  Temp:  [98.2 F (36.8 C)-98.4 F (36.9 C)] 98.4 F (36.9 C) (01/21 1203) Pulse Rate:  [90-95] 95  (01/21 1203) Resp:  [18-20] 18  (01/21 1203) BP: (92-114)/(49-62) 92/51 mmHg (01/21 1203) Hemoglobin  Date Value Range Status  01/03/2013 9.5* 12.0 - 15.0 g/dL Final     HCT  Date Value Range Status  01/03/2013 27.8* 36.0 - 46.0 % Final    Hospital Course:  Hospital Course: Admitted for PTL, advanced cervical dilation with twin gestation.  Pt has occasional contractions.  She takes vaginal progesterone and had a Korea which was sig forfrank  Breech  Of baby A transverse presentation of baby B.  She desires to go home on pelvic rest.  She has been discharged on pelvic and bed rest.  She will continue progesterone.  She was given rupture and PTL precautions.  She will have an Korea and folllow up visit in one week   Discharge Diagnoses: False labor-undelivered and PTL  Discharge Information: Date: 01/12/2013 Activity: pelvic rest Diet: routine Medications:    Medication List     As of 01/12/2013  5:57 PM    START taking these medications         DSS 100 MG Caps   Take 100 mg by mouth daily.      progesterone 200 MG capsule   Commonly known as: PROMETRIUM   Take 1 capsule (200 mg total) by mouth at bedtime.      CONTINUE taking these medications         hydrocortisone 2.5 % rectal cream   Commonly known as: ANUSOL-HC   Place rectally 2 (two) times daily.      ondansetron 8 MG disintegrating tablet   Commonly known as: ZOFRAN-ODT   Take 1 tablet (8 mg total) by mouth every 8 (eight) hours as needed. For nausea          Where to get your medications    These are the prescriptions that you need to  pick up.   You may get these medications from any pharmacy.         DSS 100 MG Caps   progesterone 200 MG capsule            Follow-up Information    Follow up with [facility]. On 01/20/2013. (at 10:00 Korea and visit)    Contact information:   [address] [place] [id number] [phone]         Newborn Data: Live born This patient has no babies on file.; APGAR , ; weight ;  Home with pt is still pregnant.  Lourdez Mcgahan A 01/12/2013, 5:57 PM

## 2013-01-12 NOTE — Discharge Instructions (Signed)
Preterm Labor  Preterm labor is when labor starts at less than 37 weeks of pregnancy. The normal length of a pregnancy is 39 to 41 weeks.  CAUSES  Often, there is no identifiable underlying cause as to why a woman goes into preterm labor. However, one of the most common known causes of preterm labor is infection. Infections of the uterus, cervix, vagina, amniotic sac, bladder, kidney, or even the lungs (pneumonia) can cause labor to start. Other causes of preterm labor include:   Urogenital infections, such as yeast infections and bacterial vaginosis.   Uterine abnormalities (uterine shape, uterine septum, fibroids, bleeding from the placenta).   A cervix that has been operated on and opens prematurely.   Malformations in the baby.   Multiple gestations (twins, triplets, and so on).   Breakage of the amniotic sac.  Additional risk factors for preterm labor include:   Previous history of preterm labor.   Premature rupture of membranes (PROM).   A placenta that covers the opening of the cervix (placenta previa).   A placenta that separates from the uterus (placenta abruption).   A cervix that is too weak to hold the baby in the uterus (incompetence cervix).   Having too much fluid in the amniotic sac (polyhydramnios).   Taking illegal drugs or smoking while pregnant.   Not gaining enough weight while pregnant.   Women younger than 18 and older than 23 years old.   Low socioeconomic status.   African-American ethnicity.  SYMPTOMS  Signs and symptoms of preterm labor include:   Menstrual-like cramps.   Contractions that are 30 to 70 seconds apart, become very regular, closer together, and are more intense and painful.   Contractions that start on the top of the uterus and spread down to the lower abdomen and back.   A sense of increased pelvic pressure or back pain.   A watery or bloody discharge that comes from the vagina.  DIAGNOSIS   A diagnosis can be confirmed by:   A vaginal exam.   An  ultrasound of the cervix.   Sampling (swabbing) cervico-vaginal secretions. These samples can be tested for the presence of fetal fibronectin. This is a protein found in cervical discharge which is associated with preterm labor.   Fetal monitoring.  TREATMENT   Depending on the length of the pregnancy and other circumstances, a caregiver may suggest bed rest. If necessary, there are medicines that can be given to stop contractions and to quicken fetal lung maturity. If labor happens before 34 weeks of pregnancy, a prolonged hospital stay may be recommended. Treatment depends on the condition of both the mother and baby.  PREVENTION  There are some things a mother can do to lower the risk of preterm labor in future pregnancies. A woman can:    Stop smoking.   Maintain healthy weight gain and avoid chemicals and drugs that are not necessary.   Be watchful for any type of infection.   Inform her caregiver if she has a known history of preterm labor.  Document Released: 02/29/2004 Document Revised: 03/02/2012 Document Reviewed: 04/05/2011  [employer] Patient Information 2013 [employer].

## 2013-01-18 ENCOUNTER — Other Ambulatory Visit: Payer: Self-pay

## 2013-01-20 ENCOUNTER — Other Ambulatory Visit: Payer: Medicaid Other

## 2013-01-20 ENCOUNTER — Other Ambulatory Visit: Payer: Self-pay | Admitting: Obstetrics and Gynecology

## 2013-01-20 ENCOUNTER — Ambulatory Visit: Payer: Medicaid Other | Admitting: Obstetrics and Gynecology

## 2013-01-20 ENCOUNTER — Encounter: Payer: Self-pay | Admitting: Obstetrics and Gynecology

## 2013-01-20 VITALS — BP 92/52 | Wt 136.0 lb

## 2013-01-20 DIAGNOSIS — O321XX0 Maternal care for breech presentation, not applicable or unspecified: Secondary | ICD-10-CM

## 2013-01-20 DIAGNOSIS — O3430 Maternal care for cervical incompetence, unspecified trimester: Secondary | ICD-10-CM

## 2013-01-20 DIAGNOSIS — O30009 Twin pregnancy, unspecified number of placenta and unspecified number of amniotic sacs, unspecified trimester: Secondary | ICD-10-CM

## 2013-01-20 NOTE — Progress Notes (Signed)
[redacted]w[redacted]d Pt has no complaints  Pt has had flu vaccine  Pt wants cervix if possible

## 2013-01-20 NOTE — Progress Notes (Signed)
[redacted]w[redacted]d no complaints No changes in plan today.  Cervix unchanged since hospital admission

## 2013-01-20 NOTE — Progress Notes (Signed)
sono Twins growth Fetus A EFW 3lbs 7oz +/- 5oz 31st %tile Fetus B EFW 3lbs 8oz +/- 5oz 36th %tile  CX 0.491 Twin pregnany. Dichorionic/diamniotic A: vertex presentation on left Anterior placenta Normal fluid: AP pocket = 4.3 cm B breech oblique - head maternal left. Right fetus. Anterior placenta. Normal fluid: AP po\cket = 4.7 cm.. CX funneling - length measured translabially: 0.49cm

## 2013-01-21 LAB — US OB FOLLOW UP

## 2013-01-27 ENCOUNTER — Ambulatory Visit: Payer: Medicaid Other | Admitting: Obstetrics and Gynecology

## 2013-01-27 ENCOUNTER — Encounter: Payer: Self-pay | Admitting: Obstetrics and Gynecology

## 2013-01-27 VITALS — BP 90/52 | Wt 136.0 lb

## 2013-01-27 DIAGNOSIS — O30009 Twin pregnancy, unspecified number of placenta and unspecified number of amniotic sacs, unspecified trimester: Secondary | ICD-10-CM

## 2013-01-27 NOTE — Progress Notes (Signed)
[redacted]w[redacted]d di-di twin pregnancy with preterm cervical change. Fatigue and pressure.ultrasound in 2 weeks

## 2013-01-27 NOTE — Progress Notes (Signed)
[redacted]w[redacted]d Pt has no complaints  

## 2013-01-28 ENCOUNTER — Encounter (HOSPITAL_COMMUNITY): Payer: Self-pay | Admitting: *Deleted

## 2013-01-28 ENCOUNTER — Inpatient Hospital Stay (HOSPITAL_COMMUNITY)
Admission: AD | Admit: 2013-01-28 | Discharge: 2013-01-31 | DRG: 765 | Disposition: A | Discharge status: Home / Self Care | Payer: Medicaid Other | Source: Ambulatory Visit | Attending: Obstetrics and Gynecology | Admitting: Obstetrics and Gynecology

## 2013-01-28 ENCOUNTER — Inpatient Hospital Stay (HOSPITAL_COMMUNITY): Payer: Medicaid Other | Admitting: Anesthesiology

## 2013-01-28 ENCOUNTER — Encounter (HOSPITAL_COMMUNITY)
Admission: AD | Disposition: A | Discharge status: Home / Self Care | Payer: Self-pay | Source: Ambulatory Visit | Attending: Obstetrics and Gynecology

## 2013-01-28 ENCOUNTER — Inpatient Hospital Stay (HOSPITAL_COMMUNITY): Payer: Medicaid Other

## 2013-01-28 ENCOUNTER — Encounter (HOSPITAL_COMMUNITY): Payer: Self-pay | Admitting: Anesthesiology

## 2013-01-28 DIAGNOSIS — O3090 Multiple gestation, unspecified, unspecified trimester: Secondary | ICD-10-CM | POA: Diagnosis present

## 2013-01-28 DIAGNOSIS — O26879 Cervical shortening, unspecified trimester: Principal | ICD-10-CM | POA: Diagnosis present

## 2013-01-28 DIAGNOSIS — D649 Anemia, unspecified: Secondary | ICD-10-CM | POA: Diagnosis not present

## 2013-01-28 DIAGNOSIS — O99892 Other specified diseases and conditions complicating childbirth: Secondary | ICD-10-CM | POA: Diagnosis present

## 2013-01-28 DIAGNOSIS — Z302 Encounter for sterilization: Secondary | ICD-10-CM

## 2013-01-28 DIAGNOSIS — Z22330 Carrier of Group B streptococcus: Secondary | ICD-10-CM

## 2013-01-28 DIAGNOSIS — O30009 Twin pregnancy, unspecified number of placenta and unspecified number of amniotic sacs, unspecified trimester: Secondary | ICD-10-CM | POA: Diagnosis present

## 2013-01-28 DIAGNOSIS — O9903 Anemia complicating the puerperium: Secondary | ICD-10-CM | POA: Diagnosis not present

## 2013-01-28 DIAGNOSIS — O30049 Twin pregnancy, dichorionic/diamniotic, unspecified trimester: Secondary | ICD-10-CM

## 2013-01-28 LAB — CBC WITH DIFFERENTIAL/PLATELET
Band Neutrophils: 0 % (ref 0–10)
Basophils Absolute: 0 10*3/uL (ref 0.0–0.1)
Basophils Relative: 0 % (ref 0–1)
Blasts: 0 %
Eosinophils Absolute: 0.2 10*3/uL (ref 0.0–0.7)
Eosinophils Relative: 1 % (ref 0–5)
HCT: 31.7 % — ABNORMAL LOW (ref 36.0–46.0)
Hemoglobin: 10.7 g/dL — ABNORMAL LOW (ref 12.0–15.0)
Lymphocytes Relative: 18 % (ref 12–46)
Lymphs Abs: 3.2 10*3/uL (ref 0.7–4.0)
MCH: 30.9 pg (ref 26.0–34.0)
MCHC: 33.8 g/dL (ref 30.0–36.0)
MCV: 91.6 fL (ref 78.0–100.0)
Metamyelocytes Relative: 1 %
Monocytes Absolute: 0.9 10*3/uL (ref 0.1–1.0)
Monocytes Relative: 5 % (ref 3–12)
Myelocytes: 0 %
Neutro Abs: 13.4 10*3/uL — ABNORMAL HIGH (ref 1.7–7.7)
Neutrophils Relative %: 75 % (ref 43–77)
Platelets: 309 10*3/uL (ref 150–400)
Promyelocytes Absolute: 0 %
RBC: 3.46 MIL/uL — ABNORMAL LOW (ref 3.87–5.11)
RDW: 13.5 % (ref 11.5–15.5)
WBC: 17.7 10*3/uL — ABNORMAL HIGH (ref 4.0–10.5)
nRBC: 0 /100 WBC

## 2013-01-28 SURGERY — Surgical Case
Anesthesia: Spinal | Site: Abdomen | Wound class: Clean Contaminated

## 2013-01-28 MED ORDER — KETOROLAC TROMETHAMINE 30 MG/ML IJ SOLN: 30.0000 mg | Freq: Four times a day (QID) | INTRAMUSCULAR | Status: AC | PRN

## 2013-01-28 MED ORDER — CITRIC ACID-SODIUM CITRATE 334-500 MG/5ML PO SOLN: 30.0000 mL | Freq: Once | ORAL | Status: DC

## 2013-01-28 MED ORDER — PROMETHAZINE HCL 25 MG/ML IJ SOLN
6.2500 mg | INTRAMUSCULAR | Status: DC | PRN
  Administered 2013-01-29: 6.25 mg via INTRAVENOUS

## 2013-01-28 MED ORDER — SCOPOLAMINE 1 MG/3DAYS TD PT72
1.0000 | MEDICATED_PATCH | Freq: Once | TRANSDERMAL | Status: DC
  Administered 2013-01-29: 1.5 mg via TRANSDERMAL

## 2013-01-28 MED ORDER — DIPHENHYDRAMINE HCL 50 MG/ML IJ SOLN: 25.0000 mg | INTRAMUSCULAR | Status: DC | PRN

## 2013-01-28 MED ORDER — DIPHENHYDRAMINE HCL 50 MG/ML IJ SOLN: 12.5000 mg | INTRAMUSCULAR | Status: DC | PRN

## 2013-01-28 MED ORDER — NALBUPHINE SYRINGE 5 MG/0.5 ML: 5.0000 mg | INJECTION | INTRAMUSCULAR | Status: DC | PRN

## 2013-01-28 MED ORDER — PHENYLEPHRINE HCL 10 MG/ML IJ SOLN
INTRAMUSCULAR | Status: DC | PRN
  Administered 2013-01-28 (×6): 80 ug via INTRAVENOUS
  Administered 2013-01-28: 40 ug via INTRAVENOUS
  Administered 2013-01-28: 80 ug via INTRAVENOUS

## 2013-01-28 MED ORDER — NALOXONE HCL 1 MG/ML IJ SOLN
1.0000 ug/kg/h | INTRAVENOUS | Status: DC | PRN
  Filled 2013-01-28: qty 2

## 2013-01-28 MED ORDER — CEFAZOLIN SODIUM-DEXTROSE 2-3 GM-% IV SOLR
2.0000 g | INTRAVENOUS | Status: AC
  Administered 2013-01-28: 2 g via INTRAVENOUS

## 2013-01-28 MED ORDER — METOCLOPRAMIDE HCL 5 MG/ML IJ SOLN: 10.0000 mg | Freq: Three times a day (TID) | INTRAMUSCULAR | Status: DC | PRN

## 2013-01-28 MED ORDER — ONDANSETRON HCL 4 MG/2ML IJ SOLN
INTRAMUSCULAR | Status: DC | PRN
  Administered 2013-01-28: 4 mg via INTRAVENOUS

## 2013-01-28 MED ORDER — FENTANYL CITRATE 0.05 MG/ML IJ SOLN: 25.0000 ug | INTRAMUSCULAR | Status: DC | PRN

## 2013-01-28 MED ORDER — ACETAMINOPHEN 10 MG/ML IV SOLN
1000.0000 mg | Freq: Four times a day (QID) | INTRAVENOUS | Status: AC | PRN
  Filled 2013-01-28: qty 100

## 2013-01-28 MED ORDER — BUPIVACAINE-EPINEPHRINE 0.5% -1:200000 IJ SOLN
INTRAMUSCULAR | Status: DC | PRN
  Administered 2013-01-28: 10 mL

## 2013-01-28 MED ORDER — OXYTOCIN 10 UNIT/ML IJ SOLN
40.0000 [IU] | INTRAVENOUS | Status: DC | PRN
  Administered 2013-01-28: 40 [IU] via INTRAVENOUS

## 2013-01-28 MED ORDER — ONDANSETRON HCL 4 MG/2ML IJ SOLN: 4.0000 mg | Freq: Three times a day (TID) | INTRAMUSCULAR | Status: DC | PRN

## 2013-01-28 MED ORDER — MEPERIDINE HCL 25 MG/ML IJ SOLN: 6.2500 mg | INTRAMUSCULAR | Status: DC | PRN

## 2013-01-28 MED ORDER — LACTATED RINGERS IV SOLN
INTRAVENOUS | Status: DC | PRN
  Administered 2013-01-28: 23:00:00 via INTRAVENOUS

## 2013-01-28 MED ORDER — CEFAZOLIN SODIUM-DEXTROSE 2-3 GM-% IV SOLR: 2.0000 g | INTRAVENOUS | Status: DC

## 2013-01-28 MED ORDER — LACTATED RINGERS IV SOLN: INTRAVENOUS | Status: DC

## 2013-01-28 MED ORDER — FAMOTIDINE IN NACL 20-0.9 MG/50ML-% IV SOLN
INTRAVENOUS | Status: AC
  Administered 2013-01-28: 20 mg
  Filled 2013-01-28: qty 50

## 2013-01-28 MED ORDER — FAMOTIDINE IN NACL 20-0.9 MG/50ML-% IV SOLN: 20.0000 mg | Freq: Once | INTRAVENOUS | Status: DC

## 2013-01-28 MED ORDER — BUPIVACAINE IN DEXTROSE 0.75-8.25 % IT SOLN
INTRATHECAL | Status: DC | PRN
  Administered 2013-01-28: 1.6 mL via INTRATHECAL

## 2013-01-28 MED ORDER — FENTANYL CITRATE 0.05 MG/ML IJ SOLN
INTRAMUSCULAR | Status: DC | PRN
  Administered 2013-01-28: 75 ug via INTRAVENOUS
  Administered 2013-01-28: 25 ug via INTRATHECAL

## 2013-01-28 MED ORDER — DIPHENHYDRAMINE HCL 25 MG PO CAPS
25.0000 mg | ORAL_CAPSULE | ORAL | Status: DC | PRN
  Filled 2013-01-28: qty 1

## 2013-01-28 MED ORDER — KETOROLAC TROMETHAMINE 30 MG/ML IJ SOLN
30.0000 mg | Freq: Four times a day (QID) | INTRAMUSCULAR | Status: AC | PRN
  Administered 2013-01-29: 30 mg via INTRAMUSCULAR

## 2013-01-28 MED ORDER — SODIUM CHLORIDE 0.9 % IJ SOLN: 3.0000 mL | INTRAMUSCULAR | Status: DC | PRN

## 2013-01-28 MED ORDER — MORPHINE SULFATE (PF) 0.5 MG/ML IJ SOLN
INTRAMUSCULAR | Status: DC | PRN
  Administered 2013-01-28: .1 mg via INTRATHECAL

## 2013-01-28 MED ORDER — CITRIC ACID-SODIUM CITRATE 334-500 MG/5ML PO SOLN
ORAL | Status: AC
  Administered 2013-01-28: 30 mL
  Filled 2013-01-28: qty 15

## 2013-01-28 MED ORDER — NALOXONE HCL 0.4 MG/ML IJ SOLN: 0.4000 mg | INTRAMUSCULAR | Status: DC | PRN

## 2013-01-28 MED ORDER — MIDAZOLAM HCL 2 MG/2ML IJ SOLN: 0.5000 mg | Freq: Once | INTRAMUSCULAR | Status: AC | PRN

## 2013-01-28 SURGICAL SUPPLY — 42 items
CLOTH BEACON ORANGE TIMEOUT ST (SAFETY) ×2 IMPLANT
CONTAINER PREFILL 10% NBF 15ML (MISCELLANEOUS) IMPLANT
DRAIN JACKSON PRT FLT 7MM (DRAIN) IMPLANT
DRAPE LG THREE QUARTER DISP (DRAPES) ×2 IMPLANT
DRESSING TELFA 8X3 (GAUZE/BANDAGES/DRESSINGS) ×2 IMPLANT
DRSG OPSITE POSTOP 4X10 (GAUZE/BANDAGES/DRESSINGS) ×2 IMPLANT
DURAPREP 26ML APPLICATOR (WOUND CARE) ×2 IMPLANT
ELECT REM PT RETURN 9FT ADLT (ELECTROSURGICAL) ×2
ELECTRODE REM PT RTRN 9FT ADLT (ELECTROSURGICAL) ×1 IMPLANT
EVACUATOR SILICONE 100CC (DRAIN) IMPLANT
EXTRACTOR VACUUM M CUP 4 TUBE (SUCTIONS) IMPLANT
GAUZE SPONGE 4X4 12PLY STRL LF (GAUZE/BANDAGES/DRESSINGS) ×4 IMPLANT
GLOVE BIOGEL PI IND STRL 8.5 (GLOVE) ×1 IMPLANT
GLOVE BIOGEL PI INDICATOR 8.5 (GLOVE) ×1
GLOVE ECLIPSE 8.0 STRL XLNG CF (GLOVE) ×4 IMPLANT
GOWN PREVENTION PLUS LG XLONG (DISPOSABLE) ×4 IMPLANT
GOWN PREVENTION PLUS XXLARGE (GOWN DISPOSABLE) ×2 IMPLANT
KIT ABG SYR 3ML LUER SLIP (SYRINGE) IMPLANT
NDL HYPO 25X1 1.5 SAFETY (NEEDLE) ×1 IMPLANT
NDL HYPO 25X5/8 SAFETYGLIDE (NEEDLE) IMPLANT
NEEDLE HYPO 25X1 1.5 SAFETY (NEEDLE) ×2 IMPLANT
NEEDLE HYPO 25X5/8 SAFETYGLIDE (NEEDLE) IMPLANT
PACK C SECTION WH (CUSTOM PROCEDURE TRAY) ×2 IMPLANT
PAD ABD 7.5X8 STRL (GAUZE/BANDAGES/DRESSINGS) ×2 IMPLANT
PAD OB MATERNITY 4.3X12.25 (PERSONAL CARE ITEMS) ×2 IMPLANT
RINGERS IRRIG 1000ML POUR BTL (IV SOLUTION) ×2 IMPLANT
SLEEVE SCD COMPRESS KNEE MED (MISCELLANEOUS) IMPLANT
STAPLER VISISTAT 35W (STAPLE) IMPLANT
SUT MNCRL AB 3-0 PS2 27 (SUTURE) IMPLANT
SUT PLAIN 0 NONE (SUTURE) IMPLANT
SUT SILK 3 0 FS 1X18 (SUTURE) IMPLANT
SUT VIC AB 0 CT1 27 (SUTURE) ×4
SUT VIC AB 0 CT1 27XBRD ANBCTR (SUTURE) ×2 IMPLANT
SUT VIC AB 2-0 CTX 36 (SUTURE) ×4 IMPLANT
SUT VIC AB 3-0 CT1 27 (SUTURE)
SUT VIC AB 3-0 CT1 TAPERPNT 27 (SUTURE) IMPLANT
SUT VIC AB 3-0 SH 27 (SUTURE)
SUT VIC AB 3-0 SH 27X BRD (SUTURE) IMPLANT
SYR CONTROL 10ML LL (SYRINGE) ×2 IMPLANT
TOWEL OR 17X24 6PK STRL BLUE (TOWEL DISPOSABLE) ×6 IMPLANT
TRAY FOLEY CATH 14FR (SET/KITS/TRAYS/PACK) ×2 IMPLANT
WATER STERILE IRR 1000ML POUR (IV SOLUTION) ×2 IMPLANT

## 2013-01-28 NOTE — H&P (Signed)
The patient was interviewed and examined today.  The previously documented history and physical examination was reviewed. There are no changes. The operative procedure was reviewed. The risks and benefits were outlined again. The specific risks include, but are not limited to, anesthetic complications, bleeding, infections, and possible damage to the surrounding organs. The patient's questions were answered.  We are ready to proceed as outlined. The likelihood of the patient achieving the goals of this procedure is very likely.   Anuel Sitter Vernon Jakiyah Stepney, M.D.  

## 2013-01-28 NOTE — Anesthesia Preprocedure Evaluation (Addendum)
Anesthesia Evaluation  Patient identified by MRN, date of birth, ID band Patient awake    Reviewed: Allergy & Precautions, H&P , NPO status , Patient's Chart, lab work & pertinent test results  Airway Mallampati: II      Dental No notable dental hx.    Pulmonary neg pulmonary ROS,  breath sounds clear to auscultation  Pulmonary exam normal       Cardiovascular Exercise Tolerance: Good negative cardio ROS  Rhythm:regular Rate:Normal     Neuro/Psych negative neurological ROS  negative psych ROS   GI/Hepatic negative GI ROS, Neg liver ROS,   Endo/Other  negative endocrine ROS  Renal/GU negative Renal ROS  negative genitourinary   Musculoskeletal   Abdominal Normal abdominal exam  (+)   Peds  Hematology negative hematology ROS (+)   Anesthesia Other Findings S/P LEEP     Smoker        H/O varicella     Abnormal Pap smear 04/2008 LEEP    Hx: UTI (urinary tract infection)     GBS carrier        First trimester bleeding 06/07/2009   H/O sciatica        Marijuana use   History of use BV (bacterial vaginosis) 2009      CIN II (cervical intraepithelial neoplasia II) 2009   Periurethral tear in female 07/04/09      Labial tear 07/04/09   H/O: menorrhagia 2011    Reproductive/Obstetrics (+) Pregnancy                           Anesthesia Physical Anesthesia Plan  ASA: II and emergent  Anesthesia Plan: Spinal   Post-op Pain Management:    Induction:   Airway Management Planned:   Additional Equipment:   Intra-op Plan:   Post-operative Plan:   Informed Consent: I have reviewed the patients History and Physical, chart, labs and discussed the procedure including the risks, benefits and alternatives for the proposed anesthesia with the patient or authorized representative who has indicated his/her understanding and acceptance.     Plan Discussed with: Anesthesiologist, CRNA and  Surgeon  Anesthesia Plan Comments:         Anesthesia Quick Evaluation  

## 2013-01-28 NOTE — Consult Note (Signed)
Neonatology Note:   Attendance at C-section:    I was asked to attend this primary C/S at 32 5/7 weeks due to twin pregnancy in labor with breech presentation. The mother is a G2P1 A pos, GBS pos with history of PTL,and presenting 8 cm dilated tonight. Pregnancy complicated by hyperemesis and 1/2 pack'day cigarette smoking. ROM at delivery, fluid clear.  [name] delivered frank breech. Infant vigorous with good spontaneous cry and tone. Needed only minimal bulb suctioning. Ap 9/9. Lungs clear to ausc in DR.   [name] delivered footling breech and was crying with good tone at birth. She appeared somewhat paler than her sister and began to have subcostal retractions at about 3-4 minutes of life. A pulse oximeter was placed, showing O2 saturations of 74% in room air. The neopuff was placed on her for support with 40% FIO2 with improvement in O2 saturations to the mid-90s.  Both infants were seen briefly by the parents, then were transported to the NICU in good condition.  Deatra James, MD

## 2013-01-28 NOTE — H&P (Signed)
Tricia Wallace is a 22 y.o. female presenting for strong ctx since earlier this evening. Denies LOF, VB, GFM x2    HPI: Pt began [facility] at 12wks Di/Di twins girl/girl  1st trim screen WNL x2 Pt struggled w severe hyperemesis w wgt loss, eventually improved, at 16wks briefly hospitalized and given IVF's and steroid taper Anatomy US at 18wks WNL At 27wks cervical shortening was noted on Korea, she began progesterone suppositories, and  rcv'd BMZ on 1/4 and 1/5 At 29wks she was hospitalized for about 10 days due to advanced dilation and baby A in footling presentation Pt signed consent for BTL Most recent US baby A was vtx      Maternal Medical History:  Reason for admission: Reason for admission: contractions.  Contractions: Onset was 3-5 hours ago.   Frequency: regular.   Perceived severity is strong.    Fetal activity: Perceived fetal activity is normal.   Last perceived fetal movement was within the past hour.    Prenatal complications: Preterm labor.     OB History    Grav Para Term Preterm Abortions TAB SAB Ect Mult Living   2 1 1       1      Past Medical History  Diagnosis Date  . S/P LEEP   . Smoker   . H/O varicella   . Abnormal Pap smear 04/2008    LEEP  . Hx: UTI (urinary tract infection)   . GBS carrier   . First trimester bleeding 06/07/2009  . H/O sciatica   . Marijuana use     History of use  . BV (bacterial vaginosis) 2009  . CIN II (cervical intraepithelial neoplasia II) 2009  . Periurethral tear in female 07/04/09  . Labial tear 07/04/09  . H/O: menorrhagia 2011   Past Surgical History  Procedure Date  . Wisdom tooth extraction   . Dilation and curettage of uterus   . Leep    Family History: family history includes Cancer in her maternal grandfather and Diabetes in her brother. Social History:  reports that she quit smoking about 3 weeks ago. Her smoking use included Cigarettes. She smoked 1.5 packs per day. She has never used smokeless  tobacco. She reports that she does not drink alcohol or use illicit drugs.   Prenatal Transfer Tool  Maternal Diabetes: No Genetic Screening: 1st trim screen WNL  Maternal Ultrasounds/Referrals: Normal Fetal Ultrasounds or other Referrals:  None Maternal Substance Abuse:  No Significant Maternal Medications:  None Significant Maternal Lab Results:  Lab values include: Group B Strep positive Other Comments:  rcv'd BMZ course 1/4 and 1/5   Review of Systems  Psychiatric/Behavioral: The patient is nervous/anxious.   All other systems reviewed and are negative.    Dilation: 8 Effacement (%): 100 Station: -2 Exam by:: S. Thersia Petraglia, CNM Blood pressure 110/65, pulse 103, temperature 98.4 F (36.9 C), temperature source Oral, resp. rate 16, height 5\' 5"  (1.651 m), weight 140 lb 6.4 oz (63.685 kg), last menstrual period 06/13/2012, SpO2 100.00%. Maternal Exam:  Uterine Assessment: Contraction strength is firm.  Contraction duration is 50 seconds. Contraction frequency is regular.   Abdomen: Patient reports no abdominal tenderness. Fetal presentation: breech [name] breech  Introitus: Normal vulva. Normal vagina.  Cervix: Cervix evaluated by digital exam.     Fetal Exam Fetal Monitor Review: Mode: ultrasound.   Pattern: accelerations present and no decelerations.    Fetal State Assessment: Category I - tracings are normal. Cat 1  x2   Physical Exam  Nursing note and vitals reviewed. Constitutional: She is oriented to person, place, and time. She appears well-developed and well-nourished. She appears distressed.       Tearful, anxious, wincing   HENT:  Head: Normocephalic.  Eyes: Pupils are equal, round, and reactive to light.  Neck: Normal range of motion.  Cardiovascular: Normal rate, regular rhythm and normal heart sounds.   Respiratory: Effort normal and breath sounds normal.  GI: Soft. Bowel sounds are normal.  Genitourinary: Vagina normal.       Cx 8+ cm, 100/ breech  confirmed w BSUS   Musculoskeletal: Normal range of motion.  Neurological: She is alert and oriented to person, place, and time. She has normal reflexes.  Skin: Skin is warm and dry.  Psychiatric: She has a normal mood and affect. Her behavior is normal.    Prenatal labs: ABO, Rh: --/--/A POS (01/19 0945) Antibody: NEG (01/19 0945) Rubella: 53.8 (09/06 1545) RPR: NON REAC (09/06 1545)  HBsAg: NEGATIVE (09/06 1545)  HIV: NON REACTIVE (09/06 1545)  GBS: POSITIVE (01/02 1715)   Assessment/Plan: Twin IUP at [redacted]w[redacted]d Active/transition labor [name] breech presentation FHR reassuring x2  Admit to pre-op Prep for c/section and BTL Dr Stefano Gaul aware    Sanda Klein M 01/28/2013, 10:45 PM

## 2013-01-28 NOTE — Anesthesia Procedure Notes (Signed)
Spinal  Patient location during procedure: OR Start time: 01/28/2013 11:07 PM Staffing Anesthesiologist: Angus Seller., Harrell Gave. Performed by: anesthesiologist  Preanesthetic Checklist Completed: patient identified, site marked, surgical consent, pre-op evaluation, timeout performed, IV checked, risks and benefits discussed and monitors and equipment checked Spinal Block Patient position: sitting Prep: DuraPrep Patient monitoring: heart rate, cardiac monitor, continuous pulse ox and blood pressure Approach: midline Location: L3-4 Injection technique: single-shot Needle Needle type: Sprotte  Needle gauge: 24 G Needle length: 9 cm Assessment Sensory level: T4 Additional Notes Patient identified.  Risk benefits discussed including failed block, incomplete pain control, headache, nerve damage, paralysis, blood pressure changes, nausea, vomiting, reactions to medication both toxic or allergic, and postpartum back pain.  Patient expressed understanding and wished to proceed.  All questions were answered.  Sterile technique used throughout procedure.  CSF was clear.  No parasthesia or other complications.  Please see nursing notes for vital signs.

## 2013-01-28 NOTE — Anesthesia Preprocedure Evaluation (Signed)
Anesthesia Evaluation  Patient identified by MRN, date of birth, ID band Patient awake    Reviewed: Allergy & Precautions, H&P , NPO status , Patient's Chart, lab work & pertinent test results  Airway Mallampati: II      Dental No notable dental hx.    Pulmonary neg pulmonary ROS,  breath sounds clear to auscultation  Pulmonary exam normal       Cardiovascular Exercise Tolerance: Good negative cardio ROS  Rhythm:regular Rate:Normal     Neuro/Psych negative neurological ROS  negative psych ROS   GI/Hepatic negative GI ROS, Neg liver ROS,   Endo/Other  negative endocrine ROS  Renal/GU negative Renal ROS  negative genitourinary   Musculoskeletal   Abdominal Normal abdominal exam  (+)   Peds  Hematology negative hematology ROS (+)   Anesthesia Other Findings S/P LEEP     Smoker        H/O varicella     Abnormal Pap smear 04/2008 LEEP    Hx: UTI (urinary tract infection)     GBS carrier        First trimester bleeding 06/07/2009   H/O sciatica        Marijuana use   History of use BV (bacterial vaginosis) 2009      CIN II (cervical intraepithelial neoplasia II) 2009   Periurethral tear in female 07/04/09      Labial tear 07/04/09   H/O: menorrhagia 2011    Reproductive/Obstetrics (+) Pregnancy                           Anesthesia Physical Anesthesia Plan  ASA: II and emergent  Anesthesia Plan: Spinal   Post-op Pain Management:    Induction:   Airway Management Planned:   Additional Equipment:   Intra-op Plan:   Post-operative Plan:   Informed Consent: I have reviewed the patients History and Physical, chart, labs and discussed the procedure including the risks, benefits and alternatives for the proposed anesthesia with the patient or authorized representative who has indicated his/her understanding and acceptance.     Plan Discussed with: Anesthesiologist, CRNA and  Surgeon  Anesthesia Plan Comments:         Anesthesia Quick Evaluation

## 2013-01-28 NOTE — MAU Note (Signed)
Pt states U/C's started tonight about 1800 and have progressively gotten stronger and closer together.   Denies vaginal bleeding or ROM.  Baby A vertex good fetal movement, Baby B transverse position hasn't been moving as much as normal.

## 2013-01-29 ENCOUNTER — Encounter (HOSPITAL_COMMUNITY): Payer: Self-pay | Admitting: *Deleted

## 2013-01-29 LAB — CBC
HCT: 28.2 % — ABNORMAL LOW (ref 36.0–46.0)
Hemoglobin: 9.4 g/dL — ABNORMAL LOW (ref 12.0–15.0)
MCH: 30.7 pg (ref 26.0–34.0)
MCHC: 33.3 g/dL (ref 30.0–36.0)
MCV: 92.2 fL (ref 78.0–100.0)
Platelets: 241 10*3/uL (ref 150–400)
RBC: 3.06 MIL/uL — ABNORMAL LOW (ref 3.87–5.11)
RDW: 13.5 % (ref 11.5–15.5)
WBC: 17.4 10*3/uL — ABNORMAL HIGH (ref 4.0–10.5)

## 2013-01-29 LAB — TYPE AND SCREEN
ABO/RH(D): A POS
Antibody Screen: NEGATIVE

## 2013-01-29 MED ORDER — HYDROCODONE-ACETAMINOPHEN 5-325 MG PO TABS
2.0000 | ORAL_TABLET | ORAL | Status: DC | PRN
  Administered 2013-01-29 – 2013-01-30 (×5): 2 via ORAL
  Filled 2013-01-29 (×5): qty 2

## 2013-01-29 MED ORDER — SIMETHICONE 80 MG PO CHEW
80.0000 mg | CHEWABLE_TABLET | Freq: Three times a day (TID) | ORAL | Status: DC
  Administered 2013-01-29 – 2013-01-31 (×7): 80 mg via ORAL

## 2013-01-29 MED ORDER — OXYTOCIN 40 UNITS IN LACTATED RINGERS INFUSION - SIMPLE MED: 62.5000 mL/h | INTRAVENOUS | Status: AC

## 2013-01-29 MED ORDER — OXYCODONE-ACETAMINOPHEN 5-325 MG PO TABS: 1.0000 | ORAL_TABLET | ORAL | Status: DC | PRN

## 2013-01-29 MED ORDER — TETANUS-DIPHTH-ACELL PERTUSSIS 5-2.5-18.5 LF-MCG/0.5 IM SUSP
0.5000 mL | Freq: Once | INTRAMUSCULAR | Status: AC
  Administered 2013-01-30: 0.5 mL via INTRAMUSCULAR
  Filled 2013-01-29: qty 0.5

## 2013-01-29 MED ORDER — PRENATAL MULTIVITAMIN CH
1.0000 | ORAL_TABLET | Freq: Every day | ORAL | Status: DC
  Administered 2013-01-29: 1 via ORAL
  Filled 2013-01-29: qty 1

## 2013-01-29 MED ORDER — MEPERIDINE HCL 25 MG/ML IJ SOLN
6.2500 mg | INTRAMUSCULAR | Status: DC | PRN
  Administered 2013-01-29: 6.25 mg via INTRAVENOUS

## 2013-01-29 MED ORDER — MEASLES, MUMPS & RUBELLA VAC ~~LOC~~ INJ
0.5000 mL | INJECTION | Freq: Once | SUBCUTANEOUS | Status: DC
  Filled 2013-01-29: qty 0.5

## 2013-01-29 MED ORDER — MENTHOL 3 MG MT LOZG: 1.0000 | LOZENGE | OROMUCOSAL | Status: DC | PRN

## 2013-01-29 MED ORDER — SIMETHICONE 80 MG PO CHEW: 80.0000 mg | CHEWABLE_TABLET | ORAL | Status: DC | PRN

## 2013-01-29 MED ORDER — SENNOSIDES-DOCUSATE SODIUM 8.6-50 MG PO TABS
2.0000 | ORAL_TABLET | Freq: Every day | ORAL | Status: DC
  Administered 2013-01-29: 2 via ORAL

## 2013-01-29 MED ORDER — DIBUCAINE 1 % RE OINT: 1.0000 "application " | TOPICAL_OINTMENT | RECTAL | Status: DC | PRN

## 2013-01-29 MED ORDER — NALBUPHINE SYRINGE 5 MG/0.5 ML
10.0000 mg | INJECTION | INTRAMUSCULAR | Status: DC | PRN
  Administered 2013-01-29: 10 mg via INTRAVENOUS
  Filled 2013-01-29: qty 1

## 2013-01-29 MED ORDER — MEDROXYPROGESTERONE ACETATE 150 MG/ML IM SUSP: 150.0000 mg | INTRAMUSCULAR | Status: DC | PRN

## 2013-01-29 MED ORDER — WITCH HAZEL-GLYCERIN EX PADS: 1.0000 "application " | MEDICATED_PAD | CUTANEOUS | Status: DC | PRN

## 2013-01-29 MED ORDER — ZOLPIDEM TARTRATE 5 MG PO TABS: 5.0000 mg | ORAL_TABLET | Freq: Every evening | ORAL | Status: DC | PRN

## 2013-01-29 MED ORDER — LANOLIN HYDROUS EX OINT: 1.0000 "application " | TOPICAL_OINTMENT | CUTANEOUS | Status: DC | PRN

## 2013-01-29 MED ORDER — DIPHENHYDRAMINE HCL 25 MG PO CAPS: 25.0000 mg | ORAL_CAPSULE | Freq: Four times a day (QID) | ORAL | Status: DC | PRN

## 2013-01-29 MED ORDER — ONDANSETRON HCL 4 MG PO TABS
4.0000 mg | ORAL_TABLET | ORAL | Status: DC | PRN
  Administered 2013-01-30 – 2013-01-31 (×2): 4 mg via ORAL
  Filled 2013-01-29 (×2): qty 1

## 2013-01-29 MED ORDER — IBUPROFEN 600 MG PO TABS
600.0000 mg | ORAL_TABLET | Freq: Four times a day (QID) | ORAL | Status: DC
  Administered 2013-01-29 – 2013-01-31 (×8): 600 mg via ORAL
  Filled 2013-01-29 (×10): qty 1

## 2013-01-29 NOTE — Anesthesia Postprocedure Evaluation (Signed)
Anesthesia Post Note  Patient: Tricia Wallace  Procedure(s) Performed: Procedure(s) (LRB): CESAREAN SECTION (N/A)  Anesthesia type: Spinal  Patient location: PACU  Post pain: Pain level controlled  Post assessment: Post-op Vital signs reviewed  Last Vitals:  Filed Vitals:   01/29/13 0015  BP: 98/50  Pulse: 83  Temp:   Resp: 29    Post vital signs: Reviewed  Level of consciousness: awake  Complications: No apparent anesthesia complications

## 2013-01-29 NOTE — Progress Notes (Signed)
Tricia Wallace is anxious to see her babies.  At the time of our visit, she had not been able to see them yet since the delivery.  She was in a good amount of pain.  She is concerned about how her 23 year old daughter, Cammy Copa, will take adjust to their new situation.  She has good family support.  We will continue to follow for support in the NICU, please also page as needs arise.  [employer] Pager, [phone] 10:57 AM   01/29/13 1000  Clinical Encounter Type  Visited With Patient and family together  Visit Type Follow-up

## 2013-01-29 NOTE — Anesthesia Postprocedure Evaluation (Signed)
  Anesthesia Post-op Note  Patient: Tricia Wallace  Procedure(s) Performed: * No procedures listed *  Patient Location: [facility]  Anesthesia Type:Spinal  Level of Consciousness: awake, alert  and oriented  Airway and Oxygen Therapy: Patient Spontanous Breathing  Post-op Pain: mild  Post-op Assessment: Patient's Cardiovascular Status Stable, Respiratory Function Stable, Patent Airway, No signs of Nausea or vomiting and Pain level controlled  Post-op Vital Signs: stable  Complications: No apparent anesthesia complications

## 2013-01-29 NOTE — Progress Notes (Signed)
CSW attempted to meet with MOB in her third floor room/303 to check on how she has been doing since initially meeting her while she was a patient on Antenatal and evaluate how she and FOB are coping with the admissions of their twins to NICU.  MOB was not in her room.  CSW spoke with FOB briefly to explain support services offered by NICU CSW and give contact information.  He was very pleasant and thanked CSW for visiting.  He states everyone is doing well at this time.  CSW will attempt follow up with MOB at a later time. 

## 2013-01-29 NOTE — Op Note (Signed)
OPERATIVE NOTE  Patient's Name: Tricia Wallace  Date of Birth: July 27, 1991  Medical Records Number: [id number]  Date of Operation: 01/29/2013  Preoperative diagnosis:  [redacted]w[redacted]d weeks gestation  Footling Breech, Twins, [redacted] weeks gestation  Preterm labor  Anemia  Desires sterilization  Postoperative diagnosis:  [redacted]w[redacted]d weeks gestation  Twin gestation  Footling breech presentation  Preterm labor  Anemia  Desires sterilization  Procedure:  Primary low transverse cesarean section  Bilateral tubal sterilization procedure via bilateral total salpingectomies  Surgeon:  Leonard Schwartz, M.D.  Assistant:  Sanda Klein, certified nurse midwife  Anesthesia:  Regional  Disposition:  Tricia Wallace is a 22 y.o. female, G2P1001, who presents at [redacted]w[redacted]d weeks gestation. The patient has been followed at the [facility] obstetrics and gynecology division of [facility] health care for women. She has the above mentioned diagnosis. She understands the indications for her procedure and she accepts the risk of, but not limited to, anesthetic complications, bleeding, infections, and possible damage to the surrounding organs.  Findings:  [name].:  3 lbs. 15 oz. female infant delivered from a footling breech presentation. Apgar scores 9 at 1 minute and 9 at 5 minutes.  [name]:  3 lbs. 11 oz. female delivered from a complete breech presentation. Apgar scores 8 at 1 minute and 8 at 5 minutes.  The uterus, fallopian tubes, and ovaries were normal for the gravid state.  Procedure:  The patient was taken to the operating room where a spinal anesthetic was given. The patient's abdomen was prepped with Chloraprep. The perineum was prepped with betadine. A Foley catheter was placed in the bladder. The patient was sterilely draped. A "timeout" was performed which properly identified the patient and the correct operative procedure. The lower abdomen was injected with half  percent Marcaine with epinephrine. A low transverse incision was made in the abdomen and carried sharply through the subcutaneous tissue, the fascia, and the anterior peritoneum. An incision was made in the lower uterine segment. The incision was extended in a low transverse fashion. The membranes were ruptured. The first infant was delivered by footling breech extraction without difficulty. The mouth and nose were suctioned. The cord was clamped and cut. The infant was handed to the awaiting pediatric team. The membranes were ruptured for the second infant. The second infant was delivered by complete breech extraction. The mouth and nose were suctioned. The cord was clamped and cut. The infant was handed to the waiting pediatric team. Routine cord blood studies were obtained. The placenta was removed. The uterine cavity was cleaned of amniotic fluid, clotted blood, and membranes. The uterine incision was closed using a running locking suture of 2-0 Vicryl. An imbricating suture of 2-0 Vicryl was placed. The patient requested salpingectomy with the hope that it may reduce her risk of potential cancers in the future. The left fallopian tube was identified and followed to its fimbriated end. The mesosalpinx was clamped. The mesosalpinx was suture ligated and then free tied using 2-0 Vicryl and plain catgut. The mesosalpinx was cut and the tube was removed. Hemostasis was adequate. An identical procedure was carried out on the opposite side. Again hemostasis was adequate. The pelvis was vigorously irrigated. The anterior peritoneum and the abdominal musculature were closed using 2-0 Vicryl. The fascia was closed using a running suture of 0 Vicryl followed by 3 interrupted sutures of 0 Vicryl. The subcutaneous layer was closed using interrupted sutures of 2-0 Vicryl. The skin was reapproximated using a subcuticular suture of  3-0 Monocryl. Sponge, needle, and instrument counts were correct on 2 occasions. The estimated  blood loss for the procedure was 700 cc. The patient tolerated her procedure well. She was transported to the recovery room in stable condition. The infant was taken to the full-term nursery in stable condition. The placenta was sent to pathology. Both fallopian tubes were sent to pathology.  Leonard Schwartz, M.D.

## 2013-01-29 NOTE — Transfer of Care (Signed)
Immediate Anesthesia Transfer of Care Note  Patient: Tricia Wallace  Procedure(s) Performed: Procedure(s) (LRB) with comments: CESAREAN SECTION (N/A)  Patient Location: PACU  Anesthesia Type:Spinal  Level of Consciousness: awake, alert , oriented and patient cooperative  Airway & Oxygen Therapy: Patient Spontanous Breathing  Post-op Assessment: Report given to PACU RN and Post -op Vital signs reviewed and stable  Post vital signs: Reviewed and stable  Complications: No apparent anesthesia complications

## 2013-01-29 NOTE — Progress Notes (Addendum)
Subjective: Postpartum Day 1: Cesarean Delivery Patient reports no complaints.  Not really hungry. Concerned about babies.  Objective: Vital signs in last 24 hours: Temp:  [97.4 F (36.3 C)-99 F (37.2 C)] 98.4 F (36.9 C) (02/07 1200) Pulse Rate:  [63-103] 79  (02/07 1200) Resp:  [14-29] 16  (02/07 1200) BP: (86-121)/(50-89) 90/51 mmHg (02/07 1200) SpO2:  [98 %-100 %] 99 % (02/07 1200) Weight:  [140 lb 6.4 oz (63.685 kg)] 140 lb 6.4 oz (63.685 kg) (02/06 2200) Foley in with 500cc  Physical Exam:  General: alert and no distress Lochia: appropriate Uterine Fundus: firm Dressing c/d/i DVT Evaluation: No evidence of DVT seen on physical exam.   Basename 01/29/13 0520 01/28/13 2245  HGB 9.4* 10.7*  HCT 28.2* 31.7*    Assessment/Plan: Status post Cesarean section. Doing well postoperatively.  Continue current care. D/C foley - UOP is good Advance diet as tolerated Encourage ambulation Tricia Wallace Y 01/29/2013, 1:39 PM

## 2013-01-30 MED ORDER — COMPLETENATE 29-1 MG PO CHEW
1.0000 | CHEWABLE_TABLET | Freq: Every day | ORAL | Status: DC
  Administered 2013-01-30 – 2013-01-31 (×2): 1 via ORAL
  Filled 2013-01-30 (×3): qty 1

## 2013-01-30 NOTE — Progress Notes (Signed)
Patient unable to void after foley removed at 1500 on 01/29/13. Patient was told she had to void by 2100 or an I &O cath would be performed. Patient stated " Let me try on my own first". At 2200 went into room and patient was off unit- down in NICU. Patient arrived back to floor around 2330. I&O cath done around 0015 with 500cc of urine produced. Patient was informed that if she was still unable to void by 6am, a second I&O cath would have to be done. Will continue to monitor.

## 2013-01-30 NOTE — Clinical Social Work Psychosocial (Signed)
    Clinical Social Work Department BRIEF PSYCHOSOCIAL ASSESSMENT 01/30/2013  Patient:  Tricia Wallace, Tricia Wallace     Account Number:  [id number]     Admit date:  01/28/2013  Clinical Social Worker:  Almeta Monas  Date/Time:  01/30/2013 10:30 AM  Referred by:  RN  Date Referred:  01/30/2013 Referred for  Other - See comment   Other Referral:   NICU admission   Interview type:  Patient Other interview type:    PSYCHOSOCIAL DATA Living Status:  FAMILY Admitted from facility:   Level of care:   Primary support name:  Tricia Wallace Primary support relationship to patient:  SPOUSE Degree of support available:   Good supports    CURRENT CONCERNS Current Concerns  Adjustment to Illness   Other Concerns:    SOCIAL WORK ASSESSMENT / PLAN CSW met with MOB at [name]'s bedside to check in now that the babies have been born.  CSW initially met MOB a month ago when she was on bedrest in Antenatal.  MOB states baby A is doing great.  She seems highly concerned about baby B. Baby B is on the ventilator and she states she is not sure when she had to be ventilated because "everything happened so fast."  She reports being in a lot of pain from the c-section.  She has a good support system and states no issues with transportation once she is discharged.  She reports that her 23 year old, Tricia Wallace is well, but will never understand what is going on with her sisters.  CSW validated MOB's feelings and explained support services offered by NICU CSW.   Assessment/plan status:   Other assessment/ plan:   Information/referral to community resources:   Babies will be referred to [facility].    PATIENT'S/FAMILY'S RESPONSE TO PLAN OF CARE: MOB states she remembers meeting CSW.  She seemed appreciative although her affect is somewhat flat.  This is consistent with the last time CSW met with her.  She states no questions or needs at this time.

## 2013-01-30 NOTE — Progress Notes (Signed)
Subjective: Postpartum Day 2: Cesarean Delivery Patient reports feeling tired and having pain with position changes.  Has also had some nausea but no vomiting.  States some flatus, but no BM.  Per RN, pt has only had 1 dose of po pain med today.  Pt took her own Zofran earlier in the day per RN with relief of pain.  States [name] is doing well and attempted to breastfeed today but [name] continues to be on ventilator at present.  Denies weakness or dizziness.  Ambulating and voiding without difficulty.    Objective: Vital signs in last 24 hours: Temp:  [97.5 F (36.4 C)-98.3 F (36.8 C)] 97.7 F (36.5 C) (02/08 1319) Pulse Rate:  [66-112] 112 (02/08 1319) Resp:  [16-20] 20 (02/08 1319) BP: (98-104)/(56-69) 104/69 mmHg (02/08 1319) SpO2:  [97 %-100 %] 98 % (02/08 1319)  Physical Exam:  General: alert, cooperative and no distress Heart:  RRR Lungs: CTA bilat Breasts:  Soft, non-tender Abd:  Soft, non-tender with pos BS x 4 quads Lochia: appropriate, sm rubra Uterine Fundus: firm, approriately tender 1 below umb. Incision: Dsg remains in place without drainage noted. DVT Evaluation: No evidence of DVT seen on physical exam. Negative Homan's sign. No significant calf/ankle edema.   Recent Labs  01/28/13 2245 01/29/13 0520  HGB 10.7* 9.4*  HCT 31.7* 28.2*    Assessment/Plan: Status post Cesarean section - Twin IUP at 32w 5d. Asymptomatic anemia.   Continue current care at present.  Encouraged pt to use pain medication as prescribed, especially prior to activity.   Anticipate discharge tomorrow.    Xan Ingraham O. 01/30/2013, 4:39 PM

## 2013-01-31 LAB — RPR: RPR Ser Ql: NONREACTIVE

## 2013-01-31 MED ORDER — IBUPROFEN 600 MG PO TABS: 600.0000 mg | ORAL_TABLET | Freq: Four times a day (QID) | ORAL | Status: DC | PRN

## 2013-01-31 NOTE — Discharge Instructions (Signed)
Cesarean Delivery  Care After  Refer to this sheet in the next few weeks. These instructions provide you with information on caring for yourself after your procedure. Your caregiver may also give you more specific instructions. Your treatment has been planned according to current medical practices, but problems sometimes occur. Call your caregiver if you have any problems or questions after your procedure.  HOME CARE INSTRUCTIONS  Healing will take time. You will have discomfort, tenderness, swelling, and bruising at the surgery site for a couple of weeks. This is normal and will get better as time goes on.  Activity  · Rest as much as possible the first 2 weeks.  · When possible, have someone help you with your household activities and your baby for 2 to 3 weeks.  · Limit your housework and social activity. Increase your activity gradually as your strength returns.  · Do not climb stairs more than 2 to 3 times a day.  · Do not lift anything heavier than your baby.  · Follow your caregiver's instructions about driving a car.  · Exercise only as directed by your caregiver.  Nutrition  · You may return to your usual diet. Eat a well-balanced diet.  · Drink enough fluid to keep your urine clear or pale yellow.  · Keep taking your prenatal or multivitamins.  · Do not drink alcohol until your caregiver says it is okay.  Elimination  You should return to your usual bowel function. If you develop constipation, ask your caregiver about taking a mild laxative that will help you go to the bathroom. Bran foods and fluids help with constipation. Gradually add fruit, vegetables, and bran to your diet.   Hygiene  · You may shower, wash your hair, and take tub baths unless your caregiver tells you otherwise.  · Continue perineal care until your vaginal bleeding and discharge stops.  · Do not douche or use tampons until your caregiver says it is okay.  Fever  If you feel feverish or have shaking chills, take your temperature. The  fever may indicate infection. Infections can be treated with antibiotic medicine.  Pain Control and Medicine  · Only take over-the-counter or prescription medicine as directed by your caregiver. Do not take aspirin. It can cause bleeding.  · Do not drive when taking pain medicine.  · Talk to your caregiver about restarting or adjusting your normal medicines.  Incision Care  · Clean your cut (incision) gently with soap and water, then pat dry.  · If your caregiver says it is okay, leave the incision without a bandage (dressing) unless it is draining fluid or irritated.  · If you have small adhesive strips across the incision and they do not fall off within 7 days, carefully peel them off.  · Check the incision daily for increased redness, drainage, swelling, or separation of skin.  · Hug a pillow when coughing or sneezing. This helps to relieve pain.  Vaginal Care  You may have a vaginal discharge or bleeding for up to 6 weeks. If the vaginal discharge becomes bright red, bad smelling, heavy in amount, has blood clots, or if you have burning or frequent urination, call your caregiver. If your bleeding slows down and then gets heavier, your body is telling you to slow down and relax more.  Sexual Intercourse  · Check with your caregiver before resuming sexual activity. Often, after 4 to 6 weeks, if you feel good and are well rested, sexual activity may be resumed. Avoid   positions that strain the incision site.  · You can become pregnant before you have a period. If you decide to have sexual intercourse, use birth control if you do not want to become pregnant right away.  Health Practices  · Keep all your postpartum appointments as recommended by your caregiver. Generally, your caregiver will want to see you in 2 to 3 weeks.  · Continue with your yearly pelvic exams.  · Continue monthly self-breast exams and yearly physical exams with a Pap test.  Breast Care  · If you are not breastfeeding and your breasts become  tender, hard, or leak milk, you may wear a tight-fitting bra and apply ice to your breasts.  · If you are breastfeeding, wear a good support bra.  · Call your caregiver if you have breast pain, flu-like symptoms, fever, or hardness and reddening of your breasts.  Postpartum Blues  You may have a period of low spirits or "blues" after your baby is born. Discuss your feelings with your partner, family, and friends. This may be caused by the changing hormone levels in your body. You may want to contact your caregiver if this is worrisome.  Miscellaneous  · Limit wearing support panties or control-top hose.  · If you breastfeed, you may not have a period for several months or longer. This is normal for the nursing mother. If you do not menstruate within 6 weeks after you stop breastfeeding, see your caregiver.  · If you are not breastfeeding, you can expect to menstruate within 6 to 10 weeks after birth. If you have not started by the 11th week, check with your caregiver.  SEEK MEDICAL CARE IF:   · There is swelling, redness, or increasing pain in the wound area.  · You have pus coming from the wound.  · You notice a bad smell from the wound or surgical dressing.  · You have pain, redness, and swelling from the intravenous (IV) site.  · Your wound breaks open (the edges are not staying together).  · You feel dizzy or feel like fainting.  · You develop pain or bleeding when you urinate.  · You develop diarrhea.  · You develop nausea and vomiting.  · You develop abnormal vaginal discharge.  · You develop a rash.  · You have any type of abnormal reaction or develop an allergy to your medicine.  · Your pain is not relieved by your medicine or becomes worse.  · Your temperature is 101° F (38.3° C), or is 100.4° F (38° C) taken 2 times in a 4 hour period.  SEEK IMMEDIATE MEDICAL CARE:  · You develop a temperature of 102° F (38.9° C) or higher.  · You develop abdominal pain.  · You develop chest pain.  · You develop shortness  of breath.  · You faint.  · You develop pain, swelling, or redness of your leg.  · You develop heavy vaginal bleeding with or without blood clots.  Document Released: 08/31/2002 Document Revised: 03/02/2012 Document Reviewed: 03/06/2011  Exit[employer]® Patient Information ©2013 [employer].

## 2013-01-31 NOTE — Progress Notes (Signed)
Patient has refused her pressure dressing over her incision be removed at this time.  Requested it be removed in AM. Patient claims, " I will shower in AM  And take it out and call the nurse."  Informed about importance of dressing coming off.  Claims understanding.

## 2013-01-31 NOTE — Progress Notes (Signed)
Entered room patient laying in bed.  Offered to assist patient to restroom and patient stated "I went in the middle of the night and I don't feel like it now".  Offered to assist patient with repositioning and stated "I'm comfortable right now".  Patient c/o of gas pain, bowel sounds active and patient reports passing gas.  Informed patient walking would assist to pass gas.  Will continue to monitor.  Osvaldo Angst, RN------

## 2013-01-31 NOTE — Progress Notes (Signed)
Subjective: Postpartum Day 3: Cesarean Delivery due to malpresentation of [name], PTL at 32 weeks. Multiple issues: Had vomiting after taking Vicodin yesterday, now incision more sore Concerned about swelling in right hand, making rings very tight Gas pain in shoulder, but passing gas Mild nausea this am, but no vomiting--feels hungry.  Took own Zofran yesterday. Very concerned about babies--[name], "Tricia Wallace", still on vent, per [facility] note, "critical but stable"; [name], "Tricia Wallace", doing well.  Patient hoping to start working on breastfeeding Tricia Wallace today. Still has pressure dressing on--refused removal yesterday, plans removing it today in the shower. Feeding:  Plans breastfeeding Contraceptive plan:  Undecided at present.  Objective: Vital signs in last 24 hours: Temp:  [97.6 F (36.4 C)-98.6 F (37 C)] 98.6 F (37 C) (02/09 0600) Pulse Rate:  [78-112] 78 (02/09 0600) Resp:  [17-20] 17 (02/09 0600) BP: (102-108)/(60-69) 108/60 mmHg (02/09 0600) SpO2:  [97 %-100 %] 97 % (02/09 0600)  Physical Exam:  General: alert Hands mildly edematous, but no evidence of need to cut off rings on right hand at present.  Good capillary refill. Lochia: appropriate Uterine Fundus: firm Abdomen--non-distended, + bowel sounds, no rebound or guarding. Incision: Pressure dressing still in place, CDI DVT Evaluation: No evidence of DVT seen on physical exam. Negative Homan's sign, minimal pedal edema JP drain:   NA   Recent Labs  01/28/13 2245 01/29/13 0520  HGB 10.7* 9.4*  HCT 31.7* 28.2*    Assessment/Plan: Status post Cesarean section day 3 NICU infants x 2, with [name] on vent  Reviewed all patient's issues and her concerns regarding babies' status. Will d/c Vicodin--offered Dilaudid as replacement, but patient elects to continue Ibuprophen only.  Feels ready to eat this am, and then will take Ibuprophen.  Declines Toradol. Zofran today as needed, to be hospital-dispensed--patient not to  self-medicate. Encouraged ambulation after Ibuprophen. Ice pack to right hand to help with removal of rings as patient desires. Support to patient for babies' issues. Will assess later today for discharge.  Will consult Dr. Su Hilt if patient refuses d/c later today.     Tricia Wallace 01/31/2013, 7:54 AM

## 2013-01-31 NOTE — Discharge Summary (Signed)
Cesarean Section Delivery Discharge Summary  Tricia Wallace  DOB:    1991-12-11 MRN:    [id number] CSN:    [id number]  Date of admission:                  01/28/13  Date of discharge:                   01/31/13  Procedures this admission: Primary LTCS for premature twins, malpresentation of [name]  Newborn Data:    Tricia Wallace [[id number]]  Live born female  Birth Weight: 3 lb 15.1 oz (1790 g) APGAR: 9, 9   Tricia Wallace [[id number]]  Live born female  Birth Weight: 3 lb 11.6 oz (1690 g) APGAR: 8, 8  Both twins remain in NICU at time of patient's discharge--[name], [name] has not required ventilation.  [name], [name], has been on the ventilator, but is being trialed off the vent this afternoon. History of Present Illness:  Tricia Wallace is a 22 y.o. female, G2P1101, who presents at [redacted]w[redacted]d weeks gestation. The patient has been followed at the [facility] and Gynecology division of [facility].    Her pregnancy has been complicated by:  Patient Active Problem List  Diagnosis  . Twin dichorionic diamniotic placenta  . History of cervical LEEP biopsy affecting care of mother, antepartum  . Flu vaccine need  . Hyperemesis complicating pregnancy, antepartum  . Short cervix affecting pregnancy  . Preterm labor with twins  . Preterm delivery of twins by C/S   Received betamethasone during pregnancy.  Had been hospitalized 01/01/13 through 01/12/13 for PTL, and was on vaginal progesterone.  Hospital course:  The patient was admitted in active labor, with cervix 8 cm and [name] in footling breech presentation.   Her postpartum course was not complicated.  She did have N/V with Vicodin, so she d/c'd that on day 2 and utilized Ibuprophen only for pain with reasonable relief.  She was discharged to home on postpartum day 3 doing well.  She was voiding without difficulty and ambulating without syncope or dizziness.  Baby A is  stable in NICU, with Baby B anticipated to come off the ventilator today due to improving status.  Feeding:  Breast, with patient pumping.  Contraception:  Undecided at present.  Discharge hemoglobin:  Hemoglobin  Date Value Range Status  01/29/2013 9.4* 12.0 - 15.0 g/dL Final     HCT  Date Value Range Status  01/29/2013 28.2* 36.0 - 46.0 % Final  Pre-delivery Hgb 10.7  Discharge Physical Exam:   General: alert Lochia: appropriate Uterine Fundus: firm Incision: healing well, with honeycomb dressing in place, CDI. DVT Evaluation: No evidence of DVT seen on physical exam. Negative Homan's sign.  Intrapartum Procedures: cesarean: low cervical, transverse, for delivery of premature twins at 32 weeks, with malpresentation of [name]. Postpartum Procedures: none Complications-Operative and Postpartum: none--both babies in NICU due to prematurity.  Discharge Diagnoses: Premature labor and delivery, Primary LTCS for malpresentation of [name]  Discharge Information:  Activity:           Per routine post-op C/S guidelines. Diet:                routine Medications: Ibuprofen Condition:      stable Instructions:  refer to practice specific booklet Discharge to: home  Follow-up Information   Follow up with [facility] & Gynecology. Schedule an appointment as soon as possible  for a visit in 6 weeks. (Call for any questions or concerns.)    Contact information:   [address] [place] [address] [phone]       Tricia Wallace 01/31/2013

## 2013-02-01 ENCOUNTER — Other Ambulatory Visit: Payer: Medicaid Other

## 2013-02-01 ENCOUNTER — Encounter (HOSPITAL_COMMUNITY): Payer: Self-pay | Admitting: Obstetrics and Gynecology

## 2013-02-01 ENCOUNTER — Encounter: Payer: Medicaid Other | Admitting: Obstetrics and Gynecology

## 2013-02-11 ENCOUNTER — Other Ambulatory Visit: Payer: Medicaid Other

## 2013-02-11 ENCOUNTER — Encounter: Payer: Medicaid Other | Admitting: Obstetrics and Gynecology

## 2013-02-20 ENCOUNTER — Inpatient Hospital Stay (HOSPITAL_COMMUNITY)
Admission: AD | Admit: 2013-02-20 | Discharge: 2013-02-20 | Disposition: A | Discharge status: Home / Self Care | Payer: Medicaid Other | Source: Ambulatory Visit | Attending: Obstetrics and Gynecology | Admitting: Obstetrics and Gynecology

## 2013-02-20 ENCOUNTER — Other Ambulatory Visit: Payer: Self-pay | Admitting: Obstetrics and Gynecology

## 2013-02-20 ENCOUNTER — Encounter (HOSPITAL_COMMUNITY): Payer: Self-pay | Admitting: Obstetrics and Gynecology

## 2013-02-20 DIAGNOSIS — O9122 Nonpurulent mastitis associated with the puerperium: Secondary | ICD-10-CM | POA: Insufficient documentation

## 2013-02-20 DIAGNOSIS — O864 Pyrexia of unknown origin following delivery: Secondary | ICD-10-CM | POA: Insufficient documentation

## 2013-02-20 LAB — URINE MICROSCOPIC-ADD ON

## 2013-02-20 LAB — COMPREHENSIVE METABOLIC PANEL
ALT: 19 U/L (ref 0–35)
AST: 21 U/L (ref 0–37)
Albumin: 3.6 g/dL (ref 3.5–5.2)
Alkaline Phosphatase: 101 U/L (ref 39–117)
BUN: 12 mg/dL (ref 6–23)
CO2: 26 mEq/L (ref 19–32)
Calcium: 9.2 mg/dL (ref 8.4–10.5)
Chloride: 98 meq/L (ref 96–112)
Creatinine, Ser: 0.77 mg/dL (ref 0.50–1.10)
GFR calc Af Amer: >90 mL/min (ref >90)
GFR calc non Af Amer: >90 mL/min (ref >90)
Glucose, Bld: 113 mg/dL — ABNORMAL HIGH (ref 70–99)
Potassium: 3.4 meq/L — ABNORMAL LOW (ref 3.5–5.1)
Sodium: 137 meq/L (ref 135–145)
Total Bilirubin: 0.4 mg/dL (ref 0.3–1.2)
Total Protein: 7.9 g/dL (ref 6.0–8.3)

## 2013-02-20 LAB — URINALYSIS, ROUTINE W REFLEX MICROSCOPIC
Bilirubin Urine: NEGATIVE
Glucose, UA: NEGATIVE mg/dL
Ketones, ur: 15 mg/dL — AB
Nitrite: NEGATIVE
Protein, ur: 30 mg/dL — AB
Specific Gravity, Urine: 1.02 (ref 1.005–1.030)
Urobilinogen, UA: 1 mg/dL (ref 0.0–1.0)
pH: 6 (ref 5.0–8.0)

## 2013-02-20 LAB — CBC WITH DIFFERENTIAL/PLATELET
Basophils Absolute: 0 10*3/uL (ref 0.0–0.1)
Basophils Relative: 0 % (ref 0–1)
Eosinophils Absolute: 0.1 10*3/uL (ref 0.0–0.7)
Eosinophils Relative: 1 % (ref 0–5)
HCT: 38.8 % (ref 36.0–46.0)
Hemoglobin: 13 g/dL (ref 12.0–15.0)
Lymphocytes Relative: 8 % — ABNORMAL LOW (ref 12–46)
Lymphs Abs: 1.1 10*3/uL (ref 0.7–4.0)
MCH: 30 pg (ref 26.0–34.0)
MCHC: 33.5 g/dL (ref 30.0–36.0)
MCV: 89.4 fL (ref 78.0–100.0)
Monocytes Absolute: 0.4 10*3/uL (ref 0.1–1.0)
Monocytes Relative: 3 % (ref 3–12)
Neutro Abs: 11.3 10*3/uL — ABNORMAL HIGH (ref 1.7–7.7)
Neutrophils Relative %: 88 % — ABNORMAL HIGH (ref 43–77)
Platelets: 225 10*3/uL (ref 150–400)
RBC: 4.34 MIL/uL (ref 3.87–5.11)
RDW: 13 % (ref 11.5–15.5)
WBC: 13 10*3/uL — ABNORMAL HIGH (ref 4.0–10.5)

## 2013-02-20 MED ORDER — AMOXICILLIN-POT CLAVULANATE 875-125 MG PO TABS
1.0000 | ORAL_TABLET | Freq: Two times a day (BID) | ORAL | Status: DC
Start: 2013-02-20 — End: 2016-12-01

## 2013-02-20 MED ORDER — LACTATED RINGERS IV BOLUS (SEPSIS)
1000.0000 mL | Freq: Once | INTRAVENOUS | Status: AC
  Administered 2013-02-20: 1000 mL via INTRAVENOUS

## 2013-02-20 MED ORDER — LACTATED RINGERS IV SOLN
INTRAVENOUS | Status: DC
  Administered 2013-02-20: 16:00:00 via INTRAVENOUS

## 2013-02-20 MED ORDER — SODIUM CHLORIDE 0.9 % IV SOLN
3.0000 g | Freq: Once | INTRAVENOUS | Status: AC
  Administered 2013-02-20: 3 g via INTRAVENOUS
  Filled 2013-02-20: qty 3

## 2013-02-20 MED ORDER — ONDANSETRON HCL 4 MG/2ML IJ SOLN: 4.0000 mg | Freq: Once | INTRAMUSCULAR | Status: DC

## 2013-02-20 MED ORDER — ACETAMINOPHEN 10 MG/ML IV SOLN
1000.0000 mg | Freq: Once | INTRAVENOUS | Status: AC
  Administered 2013-02-20: 1000 mg via INTRAVENOUS
  Filled 2013-02-20: qty 100

## 2013-02-20 NOTE — MAU Provider Note (Signed)
History     CSN: [id number]  Arrival date and time: 02/20/13 1410   None     Chief Complaint  Patient presents with  . Fever   HPI Comments: Pt is a [id number] s/p primary c/s on 2/6 for breech presentation and twins at 33wks. She c/o fever since Thursday, last night was 104, has been taking motrin and tylenol. Just brought babies home from NICU this week, has been pumping and BF. Denies UTI or respiratory sx's. Denies any abdominal pain. Has not had any vaginal bleeding. C/o L breast pain and engorgement.   Fever  Associated symptoms include chest pain, nausea and vomiting. Pertinent negatives include no abdominal pain, congestion, coughing, diarrhea, headaches or sore throat.      Past Medical History  Diagnosis Date  . S/P LEEP   . Smoker   . H/O varicella   . Abnormal Pap smear 04/2008    LEEP  . Hx: UTI (urinary tract infection)   . GBS carrier   . First trimester bleeding 06/07/2009  . H/O sciatica   . Marijuana use     History of use  . BV (bacterial vaginosis) 2009  . CIN II (cervical intraepithelial neoplasia II) 2009  . Periurethral tear in female 07/04/09  . Labial tear 07/04/09  . H/O: menorrhagia 2011    Past Surgical History  Procedure Laterality Date  . Wisdom tooth extraction    . Dilation and curettage of uterus    . Leep    . Cesarean section N/A 01/28/2013    Procedure: CESAREAN SECTION;  Surgeon: Kirkland Hun, MD;  Location: [facility];  Service: Obstetrics;  Laterality: N/A;    Family History  Problem Relation Age of Onset  . Diabetes Brother   . Cancer Maternal Grandfather     Lung    History  Substance Use Topics  . Smoking status: Former Smoker -- 1.50 packs/day    Types: Cigarettes    Quit date: 01/02/2013  . Smokeless tobacco: Never Used  . Alcohol Use: No    Allergies: No Known Allergies  Prescriptions prior to admission  Medication Sig Dispense Refill  . acetaminophen (TYLENOL) 500 MG tablet Take 1,000 mg by mouth every 6 (six)  hours as needed for pain.      Marland Kitchen ibuprofen (ADVIL,MOTRIN) 200 MG tablet Take 400 mg by mouth every 6 (six) hours as needed for pain.        Review of Systems  Constitutional: Positive for fever and malaise/fatigue.  HENT: Negative for congestion, sore throat and neck pain.   Respiratory: Negative for cough.   Cardiovascular: Positive for chest pain.  Gastrointestinal: Positive for nausea and vomiting. Negative for heartburn, abdominal pain, diarrhea and constipation.  Genitourinary: Negative for dysuria and urgency.  Musculoskeletal: Positive for myalgias.  Neurological: Positive for dizziness and weakness. Negative for headaches.  All other systems reviewed and are negative.   Physical Exam   Blood pressure 106/63, pulse 125, temperature 100.4 F (38 C), temperature source Oral, resp. rate 18, height 5\' 6"  (1.676 m), weight 117 lb 3.2 oz (53.162 kg), currently breastfeeding.  Physical Exam  Nursing note and vitals reviewed. Constitutional: She is oriented to person, place, and time. She appears well-developed and well-nourished.  HENT:  Head: Normocephalic.  Eyes: Pupils are equal, round, and reactive to light.  Neck: Normal range of motion.  Cardiovascular: Regular rhythm and normal heart sounds.   Tachycardic   Respiratory: Effort normal and breath sounds normal. Left breast exhibits tenderness.  L breast engorged, firm, tender to touch, R breast normal   GI: Soft. Bowel sounds are normal. She exhibits no distension. There is no tenderness.  Genitourinary:  Deferred   Musculoskeletal: Normal range of motion.  Lymphadenopathy:    She has no cervical adenopathy.  Neurological: She is alert and oriented to person, place, and time.  Skin: Skin is warm and dry.  Psychiatric: She has a normal mood and affect. Her behavior is normal.    MAU Course  Procedures    Assessment and Plan  post-op c/s on 2/6 Fever Likely mastitis   IVF's IV tylenol IV zofran IV  unasyn CBC - WBC 13.0 w mild L shift on differential  UA mod leukocytes but also appears contaminated, sent for culture   D/w Dr Su Hilt  Will discharge home w augmentin 875mg  PO BID x10days PO hydration Rest PO tylenol/motrin F/u office on Wednesday or call if fever persists, sx's worsen   Ann Groeneveld M 02/20/2013, 4:14 PM

## 2013-02-20 NOTE — Discharge Instructions (Signed)
Coconut oil -   Breastfeeding, Mastitis Breastfeeding is usually the best way to feed your baby. Breastfed babies tend to be healthier and less affected by disease. Breastfed babies may have better brain development and be less likely to be overweight than formula-fed babies. Breastfeeding also benefits the mother. Breastfeeding reduces the risk of breast cancer. It will give you time to be close to your baby and helps create a strong bond. It also delays the return of your periods, stimulates your uterus to contract back to normal, and helps you lose some of the weight you gained during pregnancy. Breastfeeding should not be painful. If there is tenderness at first, it should gradually go away as the days go by. Poor latch-on and positioning are common causes of sore nipples. This is usually because the baby is not getting enough of the areola (the colored portion around the nipple) into his or her mouth, and is sucking mostly on the nipple. In general, nurse early and often, nurse with the nipple and areola in the baby's mouth, not just the nipple and feed your baby on demand. CAUSES  It is common for many women to have a plugged duct in the breast at some point if she breastfeeds. A plugged milk duct feels like a tender, sore, lump in the breast. It is not accompanied by a fever or other symptoms. It happens when a milk duct does not properly drain, and the breast becomes inflamed (red and sore). Then, pressure builds up behind the plug. A plugged duct usually only occurs in one breast at a time.  A breast infection (mastitis), on the other hand, is soreness or a lump in the breast that can be accompanied by a fever or flu-like symptoms, such as feeling run down or very achy. Some women with a breast infection also have nausea and vomiting. You also may have yellowish discharge from the nipple that looks like colostrum, or the breasts feel warm or hot to the touch and appear pink or red. A breast  infection can occur when other family members have a cold or the flu, and like a plugged duct, it usually only occurs in one breast. It is not always easy to tell the difference between a breast infection and a plugged duct because both have similar symptoms and can improve within 24 to 48 hours.  Using one position to breastfeed may not empty your breasts properly or completely and could lead to engorgement and mastitis.  Wearing a bra that is too tight may restrict the flow of your milk.  Mastitis develops because bacteria get under the skin through cracks in the nipple and into the breasts and an infection develops. TREATMENT   Breastfeed or pump the breast more often on the affected side. This helps loosen the plug, keeps the milk moving freely, and the breast from becoming overly full. Nursing every 2 hours, both day and night on the affected side first can be helpful.  Getting extra sleep or relaxing with your feet up can help speed healing. Often, a plugged duct or breast infection is the first sign that a mother is doing too much and becoming overly tired.  Wear a well-fitting, supportive bra that is not too tight, since this can constrict milk ducts.  If you do not feel better within 24 hours of trying these steps, and you have a fever or your symptoms worsen, call your caregiver. You may need an antibiotic. If you have a breast infection in  which both breasts look affected, or there is pus or blood in the milk, red streaks near the painful area, or your symptoms came suddenly, see your caregiver right away.  Even if you need an antibiotic, continuing to breastfeed during treatment is best for both you and your baby. Most antibiotics passed in your breast milk will not hurt your baby.  Increase your fluid intake especially if you have a fever  If mastitis is not treated quickly and properly, you may develop a breast abscess. THRUSH  Thrush (yeast) is a fungal infection that can form  on your nipples or in your breast because it thrives on milk.  The infection forms from an overgrowth of the candida organism. Candida usually lives in our bodies and is kept at healthy levels by the natural bacteria in our bodies. But, when the natural balance of bacteria is upset, candida can overgrow, causing an infection. Some of the things that can cause thrush include:   Having an overly moist environment on your skin or nipples that are sore or cracked.  Taking antibiotics, birth control pills, or steroids.  Having a diet that contains large amounts of sugar or foods with yeast.  Women with diabetes have a higher incidence of thrush and fungal infections.  Having a chronic illness like HIV infection, diabetes, or anemia. These are symptoms of thrush:  Having sore nipples that last more than a few days, even when your baby's latch and positioning is correct.  Getting sore nipples suddenly after several weeks of normal nursing.  Pink, flaky, shiny, itchy, or cracked nipples, or deep pink and blistered nipples.  Shooting pains deep in the breast during or after feedings, or achy breasts. This may be from a candida infection of the milk ducts.  The infection can form in your baby's mouth from having contact with your nipples, and appear as little white spots on the inside of the cheeks, gums, or tongue. It also can appear as a diaper rash (small red dots around a rash) on your baby that will not go away by using regular diaper rash ointments. Many babies with thrush refuse to nurse, or are gassy or cranky. HOME CARE INSTRUCTIONS  If you or your baby have any of the above symptoms, contact your caregiver and your baby's caregiver so you both can be correctly diagnosed.  You can get medication for your nipples and for your baby. Medication for a mother may be an ointment for the nipples, and your baby can be given a liquid medication for his or her mouth, or an ointment for any diaper  rash.  Change disposable nursing pads often and wash any towels or clothing that have come in contact with the yeast in very hot water above 122 F (50 C).  Wear a clean bra every day.  Only use cotton pads.  Wash your hands often, and wash your baby's hands often, especially if he or she sucks on his or her fingers.  Boil any pacifiers, bottle nipples, or toys your baby puts in his or her mouth once a day for 20 minutes to kill the thrush. After 1 week of treatment, discard pacifiers and nipples and buy new ones. Boil all breast pump parts that touch the milk for 20 minutes daily.  Make sure other family members are free of thrush or other fungal infections. If they have symptoms, get treatment for them. MAKE SURE YOU:   Understand these instructions.  Will watch your condition.  Will  get help right away if you are not doing well or get worse. Document Released: 04/05/2005 Document Revised: 03/02/2012 Document Reviewed: 08/24/2008 [facility] Patient Information 2013 [place], [place].

## 2013-02-20 NOTE — MAU Note (Signed)
Pt reports having a fever since Thursday. High last night 104 took tylenol and went down for 45 min to 98.8 went right back up again to 102 and has been that way all day. Pt breast feeding trying to pump . Left side no milk right side very little. Pt delivered twins 2/6 brought both twins home this week and trying to breast feed breast are sore.

## 2013-02-20 NOTE — MAU Note (Signed)
"  I started feeling terrible on Thursday about 0530.  I was freezing cold, but pouring sweat.  I got up to get something to drink and then threw up.  I took my temp and it was 103.8 and my temp has been staying consistently high.  I took my temp before I left home and my temp was 102.  I took some Tylenol 1000 mg about 45 mins.  I've been rotating Tylenol and Ibuprofen.   When she took my temp just then it was 100.4."

## 2013-02-21 LAB — URINE CULTURE: Colony Count: 50000

## 2013-02-26 ENCOUNTER — Telehealth: Payer: Self-pay | Admitting: Obstetrics and Gynecology

## 2013-02-26 NOTE — Telephone Encounter (Signed)
TC to pt using office cell phone. Per SL, to check status and sched appt for next week. Pt to call after hours if unable to reach office if has any concerns or else call 03/01/13.

## 2013-03-01 ENCOUNTER — Telehealth: Payer: Self-pay | Admitting: Obstetrics and Gynecology

## 2013-03-01 NOTE — Telephone Encounter (Signed)
TC to pt. LM to return call.per SL for F/u

## 2013-03-03 ENCOUNTER — Telehealth: Payer: Self-pay | Admitting: Obstetrics and Gynecology

## 2013-03-03 NOTE — Telephone Encounter (Signed)
Returned pt's call. States now has very engorged rt breast x 2 days. Temp? No flu like sx. Has 2 antibiotics left. Unable to express any milk. Will call provider and call pt with recommendation. PT [phone]

## 2014-10-24 ENCOUNTER — Encounter (HOSPITAL_COMMUNITY): Payer: Self-pay | Admitting: Obstetrics and Gynecology

## 2015-03-10 DIAGNOSIS — J0301 Acute recurrent streptococcal tonsillitis: Secondary | ICD-10-CM | POA: Insufficient documentation

## 2015-06-23 DIAGNOSIS — M41114 Juvenile idiopathic scoliosis, thoracic region: Secondary | ICD-10-CM | POA: Insufficient documentation

## 2016-05-23 DIAGNOSIS — Z681 Body mass index (BMI) 19 or less, adult: Secondary | ICD-10-CM | POA: Diagnosis not present

## 2016-05-23 DIAGNOSIS — Z3169 Encounter for other general counseling and advice on procreation: Secondary | ICD-10-CM | POA: Diagnosis not present

## 2016-05-23 DIAGNOSIS — Z01419 Encounter for gynecological examination (general) (routine) without abnormal findings: Secondary | ICD-10-CM | POA: Diagnosis not present

## 2016-05-23 DIAGNOSIS — Z113 Encounter for screening for infections with a predominantly sexual mode of transmission: Secondary | ICD-10-CM | POA: Diagnosis not present

## 2016-05-28 DIAGNOSIS — N3000 Acute cystitis without hematuria: Secondary | ICD-10-CM | POA: Diagnosis not present

## 2016-05-28 DIAGNOSIS — N730 Acute parametritis and pelvic cellulitis: Secondary | ICD-10-CM | POA: Diagnosis not present

## 2016-05-28 DIAGNOSIS — R102 Pelvic and perineal pain: Secondary | ICD-10-CM | POA: Diagnosis not present

## 2016-05-28 DIAGNOSIS — R399 Unspecified symptoms and signs involving the genitourinary system: Secondary | ICD-10-CM | POA: Diagnosis not present

## 2016-06-06 DIAGNOSIS — N730 Acute parametritis and pelvic cellulitis: Secondary | ICD-10-CM | POA: Diagnosis not present

## 2016-08-14 DIAGNOSIS — Z13228 Encounter for screening for other metabolic disorders: Secondary | ICD-10-CM | POA: Diagnosis not present

## 2016-08-14 DIAGNOSIS — Z31430 Encounter of female for testing for genetic disease carrier status for procreative management: Secondary | ICD-10-CM | POA: Diagnosis not present

## 2016-08-14 DIAGNOSIS — Z3161 Procreative counseling and advice using natural family planning: Secondary | ICD-10-CM | POA: Diagnosis not present

## 2016-08-14 DIAGNOSIS — N971 Female infertility of tubal origin: Secondary | ICD-10-CM | POA: Diagnosis not present

## 2016-08-14 DIAGNOSIS — Z319 Encounter for procreative management, unspecified: Secondary | ICD-10-CM | POA: Diagnosis not present

## 2016-08-14 DIAGNOSIS — Z9851 Tubal ligation status: Secondary | ICD-10-CM | POA: Diagnosis not present

## 2016-08-14 DIAGNOSIS — Z130 Encounter for screening for diseases of the blood and blood-forming organs and certain disorders involving the immune mechanism: Secondary | ICD-10-CM | POA: Diagnosis not present

## 2016-08-16 DIAGNOSIS — Z319 Encounter for procreative management, unspecified: Secondary | ICD-10-CM | POA: Diagnosis not present

## 2016-08-22 DIAGNOSIS — N971 Female infertility of tubal origin: Secondary | ICD-10-CM | POA: Diagnosis not present

## 2016-08-22 DIAGNOSIS — N8500 Endometrial hyperplasia, unspecified: Secondary | ICD-10-CM | POA: Diagnosis not present

## 2016-08-22 DIAGNOSIS — N978 Female infertility of other origin: Secondary | ICD-10-CM | POA: Diagnosis not present

## 2016-08-22 DIAGNOSIS — E288 Other ovarian dysfunction: Secondary | ICD-10-CM | POA: Diagnosis not present

## 2016-08-22 DIAGNOSIS — Z319 Encounter for procreative management, unspecified: Secondary | ICD-10-CM | POA: Diagnosis not present

## 2016-08-22 DIAGNOSIS — Z3141 Encounter for fertility testing: Secondary | ICD-10-CM | POA: Diagnosis not present

## 2016-08-22 MED FILL — ESTRADIOL 0.1 MG PATCH: 0.1 | 24 days supply | Qty: 8 | Fill #0

## 2016-08-22 MED FILL — PROGESTERONE OIL 50 MG/ML V: 50 | 30 days supply | Qty: 30 | Fill #0

## 2016-08-22 MED FILL — DOXYCYCLINE HYCLATE 100 MG: 100 | 20 days supply | Qty: 40 | Fill #0

## 2016-08-22 MED FILL — MENOPUR 75 UNIT VIAL: 75 | 10 days supply | Qty: 10 | Fill #0

## 2016-08-22 MED FILL — BD 3 ML SYRINGE 18GX1-1/2: 18G X 1-1/2 | 30 days supply | Qty: 50 | Fill #0

## 2016-08-22 MED FILL — BD NEEDLES 22GX1.5: 22G X 1-1/2 | 30 days supply | Qty: 30 | Fill #0

## 2016-08-22 MED FILL — METHYLPREDNISOLONE 4 MG TAB: 4 | 4 days supply | Qty: 16 | Fill #0

## 2016-08-22 MED FILL — PREGNYL 10,000 UNITS VIAL: 10000 | 1 days supply | Qty: 1 | Fill #0

## 2016-08-22 MED FILL — BD NEEDLES 30GX0.5: 30G X 1/2" | 20 days supply | Qty: 20 | Fill #0

## 2016-08-23 MED FILL — GONAL-F 1,050 UNITS VIAL: 1050 | 2 days supply | Qty: 2 | Fill #0

## 2016-08-23 MED FILL — GONAL-F RFF REDI-JECT 450 U: 450 | 2 days supply | Qty: 2 | Fill #0

## 2016-08-29 DIAGNOSIS — Z319 Encounter for procreative management, unspecified: Secondary | ICD-10-CM | POA: Diagnosis not present

## 2016-08-29 MED FILL — CETROTIDE 0.25 MG KIT: 0.25 | 4 days supply | Qty: 4 | Fill #0

## 2016-09-02 DIAGNOSIS — Z3183 Encounter for assisted reproductive fertility procedure cycle: Secondary | ICD-10-CM | POA: Diagnosis not present

## 2016-09-04 DIAGNOSIS — Z3183 Encounter for assisted reproductive fertility procedure cycle: Secondary | ICD-10-CM | POA: Diagnosis not present

## 2016-09-07 DIAGNOSIS — Z3183 Encounter for assisted reproductive fertility procedure cycle: Secondary | ICD-10-CM | POA: Diagnosis not present

## 2016-09-08 DIAGNOSIS — Z3183 Encounter for assisted reproductive fertility procedure cycle: Secondary | ICD-10-CM | POA: Diagnosis not present

## 2016-09-10 DIAGNOSIS — Z3183 Encounter for assisted reproductive fertility procedure cycle: Secondary | ICD-10-CM | POA: Diagnosis not present

## 2016-09-15 DIAGNOSIS — Z3183 Encounter for assisted reproductive fertility procedure cycle: Secondary | ICD-10-CM | POA: Diagnosis not present

## 2016-09-18 DIAGNOSIS — J069 Acute upper respiratory infection, unspecified: Secondary | ICD-10-CM | POA: Diagnosis not present

## 2016-09-18 DIAGNOSIS — B9789 Other viral agents as the cause of diseases classified elsewhere: Secondary | ICD-10-CM | POA: Diagnosis not present

## 2016-09-23 DIAGNOSIS — Z3200 Encounter for pregnancy test, result unknown: Secondary | ICD-10-CM | POA: Diagnosis not present

## 2016-09-25 DIAGNOSIS — Z3201 Encounter for pregnancy test, result positive: Secondary | ICD-10-CM | POA: Diagnosis not present

## 2016-10-04 MED FILL — BD 3 ML SYRINGE 18GX1-1/2: 18G X 1-1/2 | 30 days supply | Qty: 50 | Fill #1

## 2016-10-04 MED FILL — BD NEEDLES 22GX1.5: 22G X 1-1/2 | 30 days supply | Qty: 30 | Fill #1

## 2016-10-04 MED FILL — BD 3 ML SYRINGE 18GX1-1/2": 18G X 1-1/2 | 30 days supply | Qty: 50 | Fill #1

## 2016-10-04 MED FILL — PROGESTERONE OIL 50 MG/ML V: 50 | 30 days supply | Qty: 30 | Fill #1

## 2016-10-04 MED FILL — BD NEEDLES 22GX1.5": 22G X 1-1/2 | 30 days supply | Qty: 30 | Fill #1

## 2016-10-07 DIAGNOSIS — Z3200 Encounter for pregnancy test, result unknown: Secondary | ICD-10-CM | POA: Diagnosis not present

## 2016-10-24 DIAGNOSIS — Z8759 Personal history of other complications of pregnancy, childbirth and the puerperium: Secondary | ICD-10-CM | POA: Diagnosis not present

## 2016-10-24 DIAGNOSIS — O0900 Supervision of pregnancy with history of infertility, unspecified trimester: Secondary | ICD-10-CM | POA: Diagnosis not present

## 2016-10-28 ENCOUNTER — Inpatient Hospital Stay (HOSPITAL_COMMUNITY)
Admission: AD | Admit: 2016-10-28 | Discharge: 2016-10-28 | Disposition: A | Discharge status: Home / Self Care | Payer: 59 | Source: Ambulatory Visit | Attending: Obstetrics and Gynecology | Admitting: Obstetrics and Gynecology

## 2016-10-28 ENCOUNTER — Encounter (HOSPITAL_COMMUNITY): Payer: Self-pay | Admitting: *Deleted

## 2016-10-28 DIAGNOSIS — O210 Mild hyperemesis gravidarum: Secondary | ICD-10-CM | POA: Diagnosis present

## 2016-10-28 DIAGNOSIS — Z87891 Personal history of nicotine dependence: Secondary | ICD-10-CM | POA: Diagnosis not present

## 2016-10-28 DIAGNOSIS — Z3A08 8 weeks gestation of pregnancy: Secondary | ICD-10-CM | POA: Diagnosis not present

## 2016-10-28 DIAGNOSIS — R112 Nausea with vomiting, unspecified: Secondary | ICD-10-CM | POA: Diagnosis not present

## 2016-10-28 LAB — URINALYSIS, ROUTINE W REFLEX MICROSCOPIC
Bilirubin Urine: NEGATIVE
Glucose, UA: NEGATIVE mg/dL
Ketones, ur: NEGATIVE mg/dL
Nitrite: NEGATIVE
Protein, ur: NEGATIVE mg/dL
Specific Gravity, Urine: 1.02 (ref 1.005–1.030)
pH: 5.5 (ref 5.0–8.0)

## 2016-10-28 LAB — URINE MICROSCOPIC-ADD ON

## 2016-10-28 NOTE — Discharge Instructions (Signed)

## 2016-10-28 NOTE — MAU Provider Note (Signed)
DATE: 10/28/2016  Maternity Admissions Unit History and Physical Exam for an Obstetrics Patient  [name] is a 25 y.o. female, [id number], at [redacted]w[redacted]d gestation, who presents for evaluation of first trimester nausea and vomiting. She has been followed at the [facility] and [facility] division of [facility].  Her pregnancy has been complicated by nausea and vomiting of pregnancy. She denies bleeding.. See history below.  OB History    Gravida Para Term Preterm AB Living   3 2 1 1   3    SAB TAB Ectopic Multiple Live Births         1 3      Past Medical History:  Diagnosis Date  . Abnormal Pap smear 04/2008   LEEP  . BV (bacterial vaginosis) 2009  . CIN II (cervical intraepithelial neoplasia II) 2009  . First trimester bleeding 06/07/2009  . GBS carrier   . H/O sciatica   . H/O varicella   . H/O: menorrhagia 2011  . Hx: UTI (urinary tract infection)   . Labial tear 07/04/09  . Marijuana use    History of use  . Periurethral tear in female 07/04/09  . S/P LEEP   . Smoker     Prescriptions Prior to Admission  Medication Sig Dispense Refill Last Dose  . acetaminophen (TYLENOL) 500 MG tablet Take 1,000 mg by mouth every 6 (six) hours as needed for pain.   02/20/2013 at Unknown  . amoxicillin-clavulanate (AUGMENTIN) 875-125 MG per tablet Take 1 tablet by mouth 2 (two) times daily. 20 tablet 0   . ibuprofen (ADVIL,MOTRIN) 200 MG tablet Take 400 mg by mouth every 6 (six) hours as needed for pain.   02/20/2013 at Unknown    Past Surgical History:  Procedure Laterality Date  . CESAREAN SECTION N/A 01/28/2013   Procedure: CESAREAN SECTION;  Surgeon: Ena Dawley, MD;  Location: [facility];  Service: Obstetrics;  Laterality: N/A;  . DILATION AND CURETTAGE OF UTERUS    . LEEP    . WISDOM TOOTH EXTRACTION      No Known Allergies  Family History: family history includes Cancer in her maternal grandfather; Diabetes in her brother.  Social History:   reports that she quit smoking about 3 years ago. Her smoking use included Cigarettes. She smoked 1.50 packs per day. She has never used smokeless tobacco. She reports that she does not drink alcohol or use drugs.  Review of systems: Normal pregnancy complaints.  Admission Physical Exam:    Body mass index is 19.08 kg/m.  Blood pressure (!) 92/51, pulse 72, temperature 99 F (37.2 C), temperature source Oral, resp. rate 16, weight 118 lb 3.2 oz (53.6 kg), currently breastfeeding.  HEENT:                 Within normal limits Chest:                   Clear Heart:                    Regular rate and rhythm  Results for orders placed or performed during the hospital encounter of 10/28/16 (from the past 24 hour(s))  Urinalysis, Routine w reflex microscopic (not at [facility])     Status: Abnormal   Collection Time: 10/28/16 12:50 PM  Result Value Ref Range   Color, Urine AMBER (A) YELLOW   APPearance CLEAR CLEAR   Specific Gravity, Urine 1.020 1.005 - 1.030   pH 5.5 5.0 -  8.0   Glucose, UA NEGATIVE NEGATIVE mg/dL   Hgb urine dipstick TRACE (A) NEGATIVE   Bilirubin Urine NEGATIVE NEGATIVE   Ketones, ur NEGATIVE NEGATIVE mg/dL   Protein, ur NEGATIVE NEGATIVE mg/dL   Nitrite NEGATIVE NEGATIVE   Leukocytes, UA SMALL (A) NEGATIVE  Urine microscopic-add on     Status: Abnormal   Collection Time: 10/28/16 12:50 PM  Result Value Ref Range   Squamous Epithelial / LPF 6-30 (A) NONE SEEN   WBC, UA 6-30 0 - 5 WBC/hpf   RBC / HPF 6-30 0 - 5 RBC/hpf   Bacteria, UA MANY (A) NONE SEEN   Urine-Other MUCOUS PRESENT      Assessment:  [redacted]w[redacted]d gestation  First trimester nausea and vomiting (negative ketones in urine)  Plan:  I reviewed the test result with the patient. I offered to have the patient wait here and maternity admissions until we are able to give her IV fluids. She declines because she has weighted quite some time already. I called a prescription to [facility] on [address] for Zofran 8  mg every 8 hours as needed for nausea, 50 tablets, 3 refills. The patient will follow-up in our office.   Doraine Schexnider V 10/28/2016, 3:23 PM

## 2016-10-28 NOTE — MAU Note (Signed)
Dr's office sent her over to get fluids.  Unable to keep anything down.  Only urinated 3 times since Sat, very dark.  She passed out at work today. Has a short cervix, planned surgery at 14 wks.

## 2016-11-06 DIAGNOSIS — Z3491 Encounter for supervision of normal pregnancy, unspecified, first trimester: Secondary | ICD-10-CM | POA: Diagnosis not present

## 2016-11-06 DIAGNOSIS — O26879 Cervical shortening, unspecified trimester: Secondary | ICD-10-CM | POA: Diagnosis not present

## 2016-11-06 DIAGNOSIS — Z8751 Personal history of pre-term labor: Secondary | ICD-10-CM | POA: Diagnosis not present

## 2016-11-06 DIAGNOSIS — Z3480 Encounter for supervision of other normal pregnancy, unspecified trimester: Secondary | ICD-10-CM | POA: Insufficient documentation

## 2016-11-06 DIAGNOSIS — O26841 Uterine size-date discrepancy, first trimester: Secondary | ICD-10-CM | POA: Diagnosis not present

## 2016-11-06 DIAGNOSIS — O3429 Maternal care due to uterine scar from other previous surgery: Secondary | ICD-10-CM | POA: Diagnosis not present

## 2016-11-06 DIAGNOSIS — O219 Vomiting of pregnancy, unspecified: Secondary | ICD-10-CM | POA: Diagnosis not present

## 2016-11-06 DIAGNOSIS — Z3A10 10 weeks gestation of pregnancy: Secondary | ICD-10-CM | POA: Diagnosis not present

## 2016-11-22 DIAGNOSIS — Z3A12 12 weeks gestation of pregnancy: Secondary | ICD-10-CM | POA: Diagnosis not present

## 2016-11-22 DIAGNOSIS — O0901 Supervision of pregnancy with history of infertility, first trimester: Secondary | ICD-10-CM | POA: Diagnosis not present

## 2016-11-22 DIAGNOSIS — Z8751 Personal history of pre-term labor: Secondary | ICD-10-CM | POA: Diagnosis not present

## 2016-11-27 DIAGNOSIS — Z3A13 13 weeks gestation of pregnancy: Secondary | ICD-10-CM | POA: Diagnosis not present

## 2016-11-27 DIAGNOSIS — Z3682 Encounter for antenatal screening for nuchal translucency: Secondary | ICD-10-CM | POA: Diagnosis not present

## 2016-11-27 DIAGNOSIS — Z36 Encounter for antenatal screening for chromosomal anomalies: Secondary | ICD-10-CM | POA: Diagnosis not present

## 2016-11-27 DIAGNOSIS — Z3491 Encounter for supervision of normal pregnancy, unspecified, first trimester: Secondary | ICD-10-CM | POA: Diagnosis not present

## 2016-12-01 ENCOUNTER — Encounter (HOSPITAL_COMMUNITY): Payer: Self-pay | Admitting: *Deleted

## 2016-12-01 ENCOUNTER — Inpatient Hospital Stay (HOSPITAL_COMMUNITY): Payer: 59

## 2016-12-01 ENCOUNTER — Inpatient Hospital Stay (HOSPITAL_COMMUNITY)
Admission: AD | Admit: 2016-12-01 | Discharge: 2016-12-01 | Disposition: A | Discharge status: Home / Self Care | Payer: 59 | Source: Ambulatory Visit | Attending: Obstetrics and Gynecology | Admitting: Obstetrics and Gynecology

## 2016-12-01 DIAGNOSIS — O26891 Other specified pregnancy related conditions, first trimester: Secondary | ICD-10-CM | POA: Insufficient documentation

## 2016-12-01 DIAGNOSIS — Z3A13 13 weeks gestation of pregnancy: Secondary | ICD-10-CM | POA: Diagnosis not present

## 2016-12-01 DIAGNOSIS — R102 Pelvic and perineal pain: Secondary | ICD-10-CM | POA: Diagnosis not present

## 2016-12-01 DIAGNOSIS — R109 Unspecified abdominal pain: Secondary | ICD-10-CM | POA: Diagnosis not present

## 2016-12-01 DIAGNOSIS — O26879 Cervical shortening, unspecified trimester: Secondary | ICD-10-CM

## 2016-12-01 DIAGNOSIS — Z87891 Personal history of nicotine dependence: Secondary | ICD-10-CM | POA: Diagnosis not present

## 2016-12-01 DIAGNOSIS — O26899 Other specified pregnancy related conditions, unspecified trimester: Secondary | ICD-10-CM

## 2016-12-01 LAB — URINALYSIS, ROUTINE W REFLEX MICROSCOPIC
Bilirubin Urine: NEGATIVE
Glucose, UA: NEGATIVE mg/dL
Hgb urine dipstick: NEGATIVE
Ketones, ur: 20 mg/dL — AB
Nitrite: NEGATIVE
Protein, ur: 30 mg/dL — AB
Specific Gravity, Urine: 1.024 (ref 1.005–1.030)
pH: 6 (ref 5.0–8.0)

## 2016-12-01 LAB — WET PREP, GENITAL
Clue Cells Wet Prep HPF POC: NONE SEEN
Sperm: NONE SEEN
Trich, Wet Prep: NONE SEEN
Yeast Wet Prep HPF POC: NONE SEEN

## 2016-12-01 NOTE — MAU Note (Signed)
Pt reports having increased pelivic pain and pressure that started yesterday got worse today. Very painful to urinate(pressure) not burning.

## 2016-12-01 NOTE — MAU Provider Note (Signed)
History     CSN: [id number]  Arrival date and time: 12/01/16 1447   First Provider Initiated Contact with Patient 12/01/16 1514      Chief Complaint  Patient presents with  . Pelvic Pain   HPI  [name] is a 25 y.o. [id number] at [redacted]w[redacted]d who presents to MAU today with complaint of increased pain and pelvic pressure since last night. The patient states that this pregnancy is a result of IVF. She also states a history of preterm cervical dilation with her previous twin pregnancy. She is being followed with q 2 week TV US for cervical length in the office. She rates her pain at 6/10 now. She states pain is constant, but worse with urination. She denies dysuria, hematuria, urgency or frequency. She also denies vaginal bleeding, discharge, fever or flank pain. She has not taken anything for pain.   OB History    Gravida Para Term Preterm AB Living   3 2 1 1   3    SAB TAB Ectopic Multiple Live Births         1 3      Past Medical History:  Diagnosis Date  . Abnormal Pap smear 04/2008   LEEP  . BV (bacterial vaginosis) 2009  . CIN II (cervical intraepithelial neoplasia II) 2009  . First trimester bleeding 06/07/2009  . GBS carrier   . H/O sciatica   . H/O varicella   . H/O: menorrhagia 2011  . Hx: UTI (urinary tract infection)   . Labial tear 07/04/09  . Marijuana use    History of use  . Periurethral tear in female 07/04/09  . S/P LEEP   . Smoker     Past Surgical History:  Procedure Laterality Date  . CESAREAN SECTION N/A 01/28/2013   Procedure: CESAREAN SECTION;  Surgeon: Tricia Dawley, MD;  Location: [facility];  Service: Obstetrics;  Laterality: N/A;  . DILATION AND CURETTAGE OF UTERUS    . LEEP    . WISDOM TOOTH EXTRACTION      Family History  Problem Relation Age of Onset  . Diabetes Brother   . Cancer Maternal Grandfather     Lung    Social History  Substance Use Topics  . Smoking status: Former Smoker    Packs/day: 1.50    Types: Cigarettes    Quit  date: 01/02/2013  . Smokeless tobacco: Never Used  . Alcohol use No    Allergies: No Known Allergies  Prescriptions Prior to Admission  Medication Sig Dispense Refill Last Dose  . ondansetron (ZOFRAN) 8 MG tablet take 1 tablet by mouth every 8 hours if needed  0 Past Month at Unknown time    Review of Systems  Constitutional: Negative for fever and malaise/fatigue.  Gastrointestinal: Positive for abdominal pain and nausea. Negative for constipation, diarrhea and vomiting.  Genitourinary: Negative for dysuria, frequency and urgency.       Neg - Vaginal bleeding, discharge   Physical Exam   Blood pressure 98/56, pulse 81, temperature 98.4 F (36.9 C), resp. rate 18, height 5\' 5"  (1.651 m), weight 116 lb 1.9 oz (52.7 kg), currently breastfeeding.  Physical Exam  Nursing note and vitals reviewed. Constitutional: She is oriented to person, place, and time. She appears well-developed and well-nourished. No distress.  HENT:  Head: Normocephalic and atraumatic.  Cardiovascular: Normal rate.   Respiratory: Effort normal.  GI: Soft. She exhibits no distension and no mass. There is tenderness (mild tenderness to palpation of the suprapubic  region bilaterally). There is no rebound and no guarding.  Genitourinary: Uterus is enlarged. Uterus is not tender. Cervix exhibits discharge. Cervix exhibits no motion tenderness and no friability. No bleeding in the vagina. Vaginal discharge (moderate amount of thin, yellow, mucus discharge noted) found.  Neurological: She is alert and oriented to person, place, and time.  Skin: Skin is warm and dry. No erythema.  Psychiatric: She has a normal mood and affect.  Dilation: Closed Effacement (%): Thick Cervical Position: Posterior Exam by:: Tricia Danker, Tricia Wallace   Results for orders placed or performed during the hospital encounter of 12/01/16 (from the past 24 hour(s))  Urinalysis, Routine w reflex microscopic     Status: Abnormal   Collection Time: 12/01/16   3:16 PM  Result Value Ref Range   Color, Urine YELLOW YELLOW   APPearance CLOUDY (A) CLEAR   Specific Gravity, Urine 1.024 1.005 - 1.030   pH 6.0 5.0 - 8.0   Glucose, UA NEGATIVE NEGATIVE mg/dL   Hgb urine dipstick NEGATIVE NEGATIVE   Bilirubin Urine NEGATIVE NEGATIVE   Ketones, ur 20 (A) NEGATIVE mg/dL   Protein, ur 30 (A) NEGATIVE mg/dL   Nitrite NEGATIVE NEGATIVE   Leukocytes, UA LARGE (A) NEGATIVE   RBC / HPF 0-5 0 - 5 RBC/hpf   WBC, UA TOO NUMEROUS TO COUNT 0 - 5 WBC/hpf   Bacteria, UA RARE (A) NONE SEEN   Squamous Epithelial / LPF 6-30 (A) NONE SEEN   Mucous PRESENT   Wet prep, genital     Status: Abnormal   Collection Time: 12/01/16  3:20 PM  Result Value Ref Range   Yeast Wet Prep HPF POC NONE SEEN NONE SEEN   Trich, Wet Prep NONE SEEN NONE SEEN   Clue Cells Wet Prep HPF POC NONE SEEN NONE SEEN   WBC, Wet Prep HPF POC MANY (A) NONE SEEN   Sperm NONE SEEN     MAU Course  Procedures None  MDM FHR - 156 bpm with doppler UA and Wet prep today  Discussed patient and results with Tricia Wallace. She recommends TV US today to assess cervical length and urine culture.  Urine culture ordered.  US shows cervical length 2.1cm without funneling.  Discussed [place] results with Tricia Wallace. She recommends pelvic rest tonight and re-assessment in the office tomorrow. Ibuprofen PRN for pain.  Assessment and Plan  A: SIUP at [redacted]w[redacted]d Cervical shortening  Pelvic pressure  P: Discharge home Ibuprofen 600 mg q 6 hours PRN advised Second trimester precautions and pelvic rest discussed Patient advised to follow-up with Tricia Wallace tomorrow. The patient was instructed to call the office to speak with Tricia Wallace assistant for an appointment  Patient may return to MAU as needed or if her condition were to change or worsen   Tricia Redden, Tricia Wallace  12/01/2016, 4:05 PM

## 2016-12-01 NOTE — Discharge Instructions (Signed)
Abdominal Pain During Pregnancy °Belly (abdominal) pain is common during pregnancy. Most of the time, it is not a serious problem. Other times, it can be a sign that something is wrong with the pregnancy. Always tell your doctor if you have belly pain. °Follow these instructions at home: °Monitor your belly pain for any changes. The following actions may help you feel better: °· Do not have sex (intercourse) or put anything in your vagina until you feel better. °· Rest until your pain stops. °· Drink clear fluids if you feel sick to your stomach (nauseous). Do not eat solid food until you feel better. °· Only take medicine as told by your doctor. °· Keep all doctor visits as told. °Get help right away if: °· You are bleeding, leaking fluid, or pieces of tissue come out of your vagina. °· You have more pain or cramping. °· You keep throwing up (vomiting). °· You have pain when you pee (urinate) or have blood in your pee. °· You have a fever. °· You do not feel your baby moving as much. °· You feel very weak or feel like passing out. °· You have trouble breathing, with or without belly pain. °· You have a very bad headache and belly pain. °· You have fluid leaking from your vagina and belly pain. °· You keep having watery poop (diarrhea). °· Your belly pain does not go away after resting, or the pain gets worse. °This information is not intended to replace advice given to you by your health care provider. Make sure you discuss any questions you have with your health care provider. °Document Released: 11/27/2009 Document Revised: 07/17/2016 Document Reviewed: 07/08/2013 °Elsevier Interactive Patient Education © 2017 Elsevier Inc. ° °

## 2016-12-02 DIAGNOSIS — Z3A13 13 weeks gestation of pregnancy: Secondary | ICD-10-CM | POA: Diagnosis not present

## 2016-12-02 DIAGNOSIS — O26879 Cervical shortening, unspecified trimester: Secondary | ICD-10-CM | POA: Diagnosis not present

## 2016-12-02 DIAGNOSIS — Z3492 Encounter for supervision of normal pregnancy, unspecified, second trimester: Secondary | ICD-10-CM | POA: Diagnosis not present

## 2016-12-02 LAB — CULTURE, OB URINE

## 2016-12-03 ENCOUNTER — Encounter (HOSPITAL_COMMUNITY): Payer: Self-pay

## 2016-12-03 ENCOUNTER — Other Ambulatory Visit (HOSPITAL_COMMUNITY): Payer: Self-pay | Admitting: *Deleted

## 2016-12-03 ENCOUNTER — Ambulatory Visit (HOSPITAL_COMMUNITY)
Admission: RE | Admit: 2016-12-03 | Discharge: 2016-12-03 | Disposition: A | Discharge status: Home / Self Care | Payer: 59 | Source: Ambulatory Visit | Attending: Obstetrics and Gynecology | Admitting: Obstetrics and Gynecology

## 2016-12-03 VITALS — BP 102/59 | HR 89 | Wt 114.4 lb

## 2016-12-03 DIAGNOSIS — O26879 Cervical shortening, unspecified trimester: Secondary | ICD-10-CM

## 2016-12-03 DIAGNOSIS — O09212 Supervision of pregnancy with history of pre-term labor, second trimester: Secondary | ICD-10-CM | POA: Diagnosis present

## 2016-12-03 DIAGNOSIS — O09899 Supervision of other high risk pregnancies, unspecified trimester: Secondary | ICD-10-CM

## 2016-12-03 DIAGNOSIS — O09219 Supervision of pregnancy with history of pre-term labor, unspecified trimester: Secondary | ICD-10-CM

## 2016-12-03 DIAGNOSIS — Z3A14 14 weeks gestation of pregnancy: Secondary | ICD-10-CM | POA: Diagnosis not present

## 2016-12-03 HISTORY — DX: Supervision of pregnancy with history of pre-term labor, unspecified trimester: O09.219

## 2016-12-03 NOTE — Progress Notes (Signed)
Maternal Fetal Medicine Consultation  Requesting Provider(s): Rivard  Primary Ob: Rivard Reason for consultation: History of PTD, dynamic cervix  HPI: She is a 25 year old P1103 at 14+0 weeks who had a 32 week preterm delivery of twins due to spontaneous preterm labor in 2014. This had been preceeded by a NSVD at term in 2010. The patient presented to the MAU on 12/10 complining of painful uterine contractions. TVUS was reviewed by Dr. Lawerance Cruel who felt that the presence of a focal LUS contraction [prevented him from making a definitive evaluation of cervical length. He suggested repeat TVUS the next day, which was performed in Dr. Boyd Kerbs office. This showed a cervical length of >47m, but with significant dynamism. She was sent to see [place] assess whether or not cerclage should be considered. 17-OHP has been planned for 16 weeks, and we are asked to comment on whether early initiation of 17-OHP is indicated. OB History: OB History    Gravida Para Term Preterm AB Living   '3 2 1 1   3   ' SAB TAB Ectopic Multiple Live Births         1 3      PMH:  Past Medical History:  Diagnosis Date  . Abnormal Pap smear 04/2008   LEEP  . BV (bacterial vaginosis) 2009  . CIN II (cervical intraepithelial neoplasia II) 2009  . First trimester bleeding 06/07/2009  . GBS carrier   . H/O sciatica   . H/O varicella   . H/O: menorrhagia 2011  . History of preterm delivery, currently pregnant 01/06/2013  . Hx: UTI (urinary tract infection)   . Labial tear 07/04/09  . Marijuana use    History of use  . Periurethral tear in female 07/04/09  . S/P LEEP   . Smoker     PSH:  Past Surgical History:  Procedure Laterality Date  . CESAREAN SECTION N/A 01/28/2013   Procedure: CESAREAN SECTION;  Surgeon: AEna Dawley MD;  [facility];  Service: Obstetrics;  Laterality: N/A;  . DILATION AND CURETTAGE OF UTERUS    . LEEP    . WISDOM TOOTH EXTRACTION     Meds: PNV and Zofran Allergies: NKDA Fh: See EPIC  section Soc: See EPIC section  Review of Systems: no vaginal bleeding or cramping/contractions, no LOF, no nausea/vomiting. All other systems reviewed and are negative.  PE: See EPIC section  A/P: History of preterm delivery in previous pregnancy and dynamic cervix in the current pregnancy  As per our discussion, I do not feel this patient is a cerclage candidate at the moment. Her history and her current cervical evaluations point to preterm contractions rather than cervical incompetence. Cerclage is not indicated in this situation. However, all situations change; current evidence suggests that cerclage can be helpful, even in the face of uterine activity, if certain clinical criteria are met. These criteria consist of a cervical length consistently less than 129mprior to 22 weeks of gestation, in the absence of PPROM or infection. I have suggested weekly evaluation, with one week in your office and the next week in ours. To that end I have set up a TVUS scan here in 2 weeks. I would proceed with 17-OHP now. There is no definite evidence showing benefit, but none showing harm, either. I agree with keeping her off work. I would consider 1076mf nifedipine q6h if her BP can tolerate it.  Thank you for allowing us [place] participate in Ms. Nance's care  Thank you for the opportunity to be a part of the care of [place]. Please contact our office if we can be of further assistance.   I spent approximately 30 minutes with this patient with over 50% of time spent in face-to-face counseling.

## 2016-12-04 ENCOUNTER — Other Ambulatory Visit: Payer: Self-pay | Admitting: Obstetrics and Gynecology

## 2016-12-05 DIAGNOSIS — O26879 Cervical shortening, unspecified trimester: Secondary | ICD-10-CM | POA: Diagnosis not present

## 2016-12-12 DIAGNOSIS — O26879 Cervical shortening, unspecified trimester: Secondary | ICD-10-CM | POA: Diagnosis not present

## 2016-12-12 DIAGNOSIS — Z3A15 15 weeks gestation of pregnancy: Secondary | ICD-10-CM | POA: Diagnosis not present

## 2016-12-12 DIAGNOSIS — Z8751 Personal history of pre-term labor: Secondary | ICD-10-CM | POA: Diagnosis not present

## 2016-12-13 ENCOUNTER — Ambulatory Visit (HOSPITAL_COMMUNITY)
Admission: RE | Admit: 2016-12-13 | Discharge: 2016-12-13 | Disposition: A | Discharge status: Home / Self Care | Payer: 59 | Source: Ambulatory Visit | Attending: Obstetrics and Gynecology | Admitting: Obstetrics and Gynecology

## 2016-12-13 ENCOUNTER — Encounter (HOSPITAL_COMMUNITY): Payer: Self-pay

## 2016-12-13 ENCOUNTER — Other Ambulatory Visit (HOSPITAL_COMMUNITY): Payer: Self-pay | Admitting: *Deleted

## 2016-12-13 ENCOUNTER — Other Ambulatory Visit (HOSPITAL_COMMUNITY): Payer: Self-pay | Admitting: Obstetrics and Gynecology

## 2016-12-13 DIAGNOSIS — Q046 Congenital cerebral cysts: Secondary | ICD-10-CM | POA: Diagnosis not present

## 2016-12-13 DIAGNOSIS — O3442 Maternal care for other abnormalities of cervix, second trimester: Secondary | ICD-10-CM

## 2016-12-13 DIAGNOSIS — O09212 Supervision of pregnancy with history of pre-term labor, second trimester: Secondary | ICD-10-CM | POA: Diagnosis not present

## 2016-12-13 DIAGNOSIS — IMO0002 Reserved for concepts with insufficient information to code with codable children: Secondary | ICD-10-CM

## 2016-12-13 DIAGNOSIS — O09219 Supervision of pregnancy with history of pre-term labor, unspecified trimester: Secondary | ICD-10-CM

## 2016-12-13 DIAGNOSIS — Z9889 Other specified postprocedural states: Secondary | ICD-10-CM

## 2016-12-13 DIAGNOSIS — O09812 Supervision of pregnancy resulting from assisted reproductive technology, second trimester: Secondary | ICD-10-CM | POA: Diagnosis not present

## 2016-12-13 DIAGNOSIS — Z3A15 15 weeks gestation of pregnancy: Secondary | ICD-10-CM

## 2016-12-13 DIAGNOSIS — O34219 Maternal care for unspecified type scar from previous cesarean delivery: Secondary | ICD-10-CM

## 2016-12-13 DIAGNOSIS — O26879 Cervical shortening, unspecified trimester: Secondary | ICD-10-CM

## 2016-12-13 DIAGNOSIS — O26872 Cervical shortening, second trimester: Secondary | ICD-10-CM | POA: Diagnosis present

## 2016-12-13 DIAGNOSIS — Z0489 Encounter for examination and observation for other specified reasons: Secondary | ICD-10-CM

## 2016-12-23 NOTE — L&D Delivery Note (Signed)
Received call from RN around 9 pm that pt was hurting really bad, contracting every 2-3 minutes when ambulating and in bed, and requesting epidural, not IV pain medication. BOWI, no augmentation. Cat 1 tracing. After epidural pt continued to make progress on own, and ctxs continued q 2-3 min.  Cvx progressed to C/C/0 at 2349 followed by SROM at 2353, clear fluid. Delivery as follows:  Delivery Note At 12:14 AM a viable female "Tricia Wallace" was delivered via Vaginal Birth After Cesarean (VBAC) (Presentation: OA, restituting to LOA). APGARS: 9, 9; weight pending.   Placenta status: Spontaneous, intact. Cord: 3 vessels with the following complications: None. Cord pH: NA  Anesthesia: None Episiotomy: None Lacerations: None Suture Repair: NA Est. Blood Loss (mL): 300  Mom to postpartum.  Baby to Couplet care / Skin to Skin. Breastfeeding. BTL w/ previous c-section. Newborn product of IVF pregnancy.  Farrel Gordon 05/26/2017, 12:37 AM

## 2016-12-26 DIAGNOSIS — Z3A17 17 weeks gestation of pregnancy: Secondary | ICD-10-CM | POA: Diagnosis not present

## 2016-12-26 DIAGNOSIS — O219 Vomiting of pregnancy, unspecified: Secondary | ICD-10-CM | POA: Diagnosis not present

## 2016-12-26 DIAGNOSIS — O09219 Supervision of pregnancy with history of pre-term labor, unspecified trimester: Secondary | ICD-10-CM | POA: Diagnosis not present

## 2016-12-26 DIAGNOSIS — Z369 Encounter for antenatal screening, unspecified: Secondary | ICD-10-CM | POA: Diagnosis not present

## 2016-12-26 DIAGNOSIS — N889 Noninflammatory disorder of cervix uteri, unspecified: Secondary | ICD-10-CM | POA: Diagnosis not present

## 2016-12-27 ENCOUNTER — Ambulatory Visit (HOSPITAL_COMMUNITY)
Admission: RE | Admit: 2016-12-27 | Discharge: 2016-12-27 | Disposition: A | Discharge status: Home / Self Care | Payer: 59 | Source: Ambulatory Visit | Attending: Obstetrics and Gynecology | Admitting: Obstetrics and Gynecology

## 2016-12-27 ENCOUNTER — Encounter (HOSPITAL_COMMUNITY): Payer: Self-pay

## 2016-12-27 DIAGNOSIS — Z0489 Encounter for examination and observation for other specified reasons: Secondary | ICD-10-CM

## 2016-12-27 DIAGNOSIS — IMO0002 Reserved for concepts with insufficient information to code with codable children: Secondary | ICD-10-CM

## 2017-01-15 DIAGNOSIS — O09219 Supervision of pregnancy with history of pre-term labor, unspecified trimester: Secondary | ICD-10-CM | POA: Diagnosis not present

## 2017-01-15 DIAGNOSIS — G930 Cerebral cysts: Secondary | ICD-10-CM | POA: Diagnosis not present

## 2017-01-15 DIAGNOSIS — Z3A19 19 weeks gestation of pregnancy: Secondary | ICD-10-CM | POA: Diagnosis not present

## 2017-01-15 DIAGNOSIS — N889 Noninflammatory disorder of cervix uteri, unspecified: Secondary | ICD-10-CM | POA: Diagnosis not present

## 2017-01-15 DIAGNOSIS — Z369 Encounter for antenatal screening, unspecified: Secondary | ICD-10-CM | POA: Diagnosis not present

## 2017-01-17 MED FILL — MAKENA 250 MG/ML VIAL: 250 | 28 days supply | Qty: 4 | Fill #0

## 2017-01-23 DIAGNOSIS — O26879 Cervical shortening, unspecified trimester: Secondary | ICD-10-CM | POA: Diagnosis not present

## 2017-01-30 DIAGNOSIS — N889 Noninflammatory disorder of cervix uteri, unspecified: Secondary | ICD-10-CM | POA: Diagnosis not present

## 2017-01-30 DIAGNOSIS — Z3A22 22 weeks gestation of pregnancy: Secondary | ICD-10-CM | POA: Diagnosis not present

## 2017-02-06 DIAGNOSIS — O26879 Cervical shortening, unspecified trimester: Secondary | ICD-10-CM | POA: Diagnosis not present

## 2017-02-14 DIAGNOSIS — Z3482 Encounter for supervision of other normal pregnancy, second trimester: Secondary | ICD-10-CM | POA: Diagnosis not present

## 2017-02-14 DIAGNOSIS — R350 Frequency of micturition: Secondary | ICD-10-CM | POA: Diagnosis not present

## 2017-02-14 DIAGNOSIS — B373 Candidiasis of vulva and vagina: Secondary | ICD-10-CM | POA: Diagnosis not present

## 2017-02-14 DIAGNOSIS — R300 Dysuria: Secondary | ICD-10-CM | POA: Diagnosis not present

## 2017-02-14 DIAGNOSIS — N898 Other specified noninflammatory disorders of vagina: Secondary | ICD-10-CM | POA: Diagnosis not present

## 2017-02-14 DIAGNOSIS — Z3A24 24 weeks gestation of pregnancy: Secondary | ICD-10-CM | POA: Diagnosis not present

## 2017-02-14 DIAGNOSIS — Z369 Encounter for antenatal screening, unspecified: Secondary | ICD-10-CM | POA: Diagnosis not present

## 2017-02-14 DIAGNOSIS — Z8751 Personal history of pre-term labor: Secondary | ICD-10-CM | POA: Diagnosis not present

## 2017-02-14 DIAGNOSIS — O3432 Maternal care for cervical incompetence, second trimester: Secondary | ICD-10-CM | POA: Diagnosis not present

## 2017-02-21 DIAGNOSIS — Z3A25 25 weeks gestation of pregnancy: Secondary | ICD-10-CM | POA: Diagnosis not present

## 2017-02-21 DIAGNOSIS — Z8751 Personal history of pre-term labor: Secondary | ICD-10-CM | POA: Diagnosis not present

## 2017-02-21 DIAGNOSIS — Z3492 Encounter for supervision of normal pregnancy, unspecified, second trimester: Secondary | ICD-10-CM | POA: Diagnosis not present

## 2017-02-21 DIAGNOSIS — O26859 Spotting complicating pregnancy, unspecified trimester: Secondary | ICD-10-CM | POA: Diagnosis not present

## 2017-02-25 DIAGNOSIS — O3432 Maternal care for cervical incompetence, second trimester: Secondary | ICD-10-CM | POA: Diagnosis not present

## 2017-02-25 DIAGNOSIS — Z3A36 36 weeks gestation of pregnancy: Secondary | ICD-10-CM | POA: Diagnosis not present

## 2017-02-25 DIAGNOSIS — Z3A26 26 weeks gestation of pregnancy: Secondary | ICD-10-CM | POA: Diagnosis not present

## 2017-02-28 DIAGNOSIS — Z8751 Personal history of pre-term labor: Secondary | ICD-10-CM | POA: Diagnosis not present

## 2017-02-28 MED FILL — MAKENA 250 MG/ML VIAL: 250 | 28 days supply | Qty: 4 | Fill #1

## 2017-03-07 DIAGNOSIS — Z8751 Personal history of pre-term labor: Secondary | ICD-10-CM | POA: Diagnosis not present

## 2017-03-11 DIAGNOSIS — N889 Noninflammatory disorder of cervix uteri, unspecified: Secondary | ICD-10-CM | POA: Diagnosis not present

## 2017-03-11 DIAGNOSIS — Z3A28 28 weeks gestation of pregnancy: Secondary | ICD-10-CM | POA: Diagnosis not present

## 2017-03-14 DIAGNOSIS — Z8751 Personal history of pre-term labor: Secondary | ICD-10-CM | POA: Diagnosis not present

## 2017-03-20 DIAGNOSIS — Z8751 Personal history of pre-term labor: Secondary | ICD-10-CM | POA: Diagnosis not present

## 2017-03-26 MED FILL — MAKENA 250 MG/ML VIAL: 250 | 28 days supply | Qty: 4 | Fill #2

## 2017-03-27 DIAGNOSIS — Z23 Encounter for immunization: Secondary | ICD-10-CM | POA: Diagnosis not present

## 2017-03-27 DIAGNOSIS — O09219 Supervision of pregnancy with history of pre-term labor, unspecified trimester: Secondary | ICD-10-CM | POA: Diagnosis not present

## 2017-03-27 DIAGNOSIS — Z3483 Encounter for supervision of other normal pregnancy, third trimester: Secondary | ICD-10-CM | POA: Diagnosis not present

## 2017-03-27 DIAGNOSIS — Z8751 Personal history of pre-term labor: Secondary | ICD-10-CM | POA: Diagnosis not present

## 2017-03-27 DIAGNOSIS — Z3A30 30 weeks gestation of pregnancy: Secondary | ICD-10-CM | POA: Diagnosis not present

## 2017-04-01 DIAGNOSIS — Z9189 Other specified personal risk factors, not elsewhere classified: Secondary | ICD-10-CM | POA: Diagnosis not present

## 2017-04-01 DIAGNOSIS — O09219 Supervision of pregnancy with history of pre-term labor, unspecified trimester: Secondary | ICD-10-CM | POA: Diagnosis not present

## 2017-04-01 DIAGNOSIS — Z3A31 31 weeks gestation of pregnancy: Secondary | ICD-10-CM | POA: Diagnosis not present

## 2017-04-03 DIAGNOSIS — Z3A31 31 weeks gestation of pregnancy: Secondary | ICD-10-CM | POA: Diagnosis not present

## 2017-04-03 DIAGNOSIS — Z8751 Personal history of pre-term labor: Secondary | ICD-10-CM | POA: Diagnosis not present

## 2017-04-10 DIAGNOSIS — O09219 Supervision of pregnancy with history of pre-term labor, unspecified trimester: Secondary | ICD-10-CM | POA: Diagnosis not present

## 2017-04-10 DIAGNOSIS — Z3A32 32 weeks gestation of pregnancy: Secondary | ICD-10-CM | POA: Diagnosis not present

## 2017-04-16 DIAGNOSIS — Z3A33 33 weeks gestation of pregnancy: Secondary | ICD-10-CM | POA: Diagnosis not present

## 2017-04-16 DIAGNOSIS — O09219 Supervision of pregnancy with history of pre-term labor, unspecified trimester: Secondary | ICD-10-CM | POA: Diagnosis not present

## 2017-04-16 MED FILL — MAKENA 250 MG/ML VIAL: 250 | 28 days supply | Qty: 4 | Fill #3

## 2017-04-24 DIAGNOSIS — O09219 Supervision of pregnancy with history of pre-term labor, unspecified trimester: Secondary | ICD-10-CM | POA: Diagnosis not present

## 2017-04-24 DIAGNOSIS — N898 Other specified noninflammatory disorders of vagina: Secondary | ICD-10-CM | POA: Diagnosis not present

## 2017-04-24 DIAGNOSIS — Z3A34 34 weeks gestation of pregnancy: Secondary | ICD-10-CM | POA: Diagnosis not present

## 2017-04-24 DIAGNOSIS — Z01419 Encounter for gynecological examination (general) (routine) without abnormal findings: Secondary | ICD-10-CM | POA: Diagnosis not present

## 2017-04-30 DIAGNOSIS — O368130 Decreased fetal movements, third trimester, not applicable or unspecified: Secondary | ICD-10-CM | POA: Diagnosis not present

## 2017-04-30 DIAGNOSIS — Z3A35 35 weeks gestation of pregnancy: Secondary | ICD-10-CM | POA: Diagnosis not present

## 2017-04-30 DIAGNOSIS — O26841 Uterine size-date discrepancy, first trimester: Secondary | ICD-10-CM | POA: Diagnosis not present

## 2017-04-30 DIAGNOSIS — O09219 Supervision of pregnancy with history of pre-term labor, unspecified trimester: Secondary | ICD-10-CM | POA: Diagnosis not present

## 2017-04-30 DIAGNOSIS — J019 Acute sinusitis, unspecified: Secondary | ICD-10-CM | POA: Diagnosis not present

## 2017-05-07 DIAGNOSIS — O09219 Supervision of pregnancy with history of pre-term labor, unspecified trimester: Secondary | ICD-10-CM | POA: Diagnosis not present

## 2017-05-07 DIAGNOSIS — Z369 Encounter for antenatal screening, unspecified: Secondary | ICD-10-CM | POA: Diagnosis not present

## 2017-05-07 DIAGNOSIS — Z3A36 36 weeks gestation of pregnancy: Secondary | ICD-10-CM | POA: Diagnosis not present

## 2017-05-15 DIAGNOSIS — Z8751 Personal history of pre-term labor: Secondary | ICD-10-CM | POA: Diagnosis not present

## 2017-05-15 DIAGNOSIS — Z3A37 37 weeks gestation of pregnancy: Secondary | ICD-10-CM | POA: Diagnosis not present

## 2017-05-15 DIAGNOSIS — O365930 Maternal care for other known or suspected poor fetal growth, third trimester, not applicable or unspecified: Secondary | ICD-10-CM | POA: Diagnosis not present

## 2017-05-15 DIAGNOSIS — O26841 Uterine size-date discrepancy, first trimester: Secondary | ICD-10-CM | POA: Diagnosis not present

## 2017-05-22 DIAGNOSIS — O0993 Supervision of high risk pregnancy, unspecified, third trimester: Secondary | ICD-10-CM | POA: Diagnosis not present

## 2017-05-22 DIAGNOSIS — O09819 Supervision of pregnancy resulting from assisted reproductive technology, unspecified trimester: Secondary | ICD-10-CM | POA: Diagnosis not present

## 2017-05-22 DIAGNOSIS — Z3A38 38 weeks gestation of pregnancy: Secondary | ICD-10-CM | POA: Diagnosis not present

## 2017-05-25 ENCOUNTER — Inpatient Hospital Stay (HOSPITAL_COMMUNITY): Payer: 59 | Admitting: Anesthesiology

## 2017-05-25 ENCOUNTER — Inpatient Hospital Stay (HOSPITAL_COMMUNITY)
Admission: AD | Admit: 2017-05-25 | Discharge: 2017-05-27 | DRG: 775 | Disposition: A | Discharge status: Home / Self Care | Payer: 59 | Source: Ambulatory Visit | Attending: Obstetrics and Gynecology | Admitting: Obstetrics and Gynecology

## 2017-05-25 ENCOUNTER — Encounter (HOSPITAL_COMMUNITY): Payer: Self-pay | Admitting: *Deleted

## 2017-05-25 DIAGNOSIS — Z3A38 38 weeks gestation of pregnancy: Secondary | ICD-10-CM | POA: Diagnosis not present

## 2017-05-25 DIAGNOSIS — Z3493 Encounter for supervision of normal pregnancy, unspecified, third trimester: Secondary | ICD-10-CM | POA: Diagnosis present

## 2017-05-25 DIAGNOSIS — Z9851 Tubal ligation status: Secondary | ICD-10-CM

## 2017-05-25 DIAGNOSIS — O34219 Maternal care for unspecified type scar from previous cesarean delivery: Secondary | ICD-10-CM

## 2017-05-25 DIAGNOSIS — O34211 Maternal care for low transverse scar from previous cesarean delivery: Principal | ICD-10-CM | POA: Diagnosis present

## 2017-05-25 DIAGNOSIS — Z87891 Personal history of nicotine dependence: Secondary | ICD-10-CM

## 2017-05-25 HISTORY — DX: Unspecified abnormal cytological findings in specimens from vagina: R87.629

## 2017-05-25 LAB — CBC
HCT: 34 % — ABNORMAL LOW (ref 36.0–46.0)
Hemoglobin: 11.7 g/dL — ABNORMAL LOW (ref 12.0–15.0)
MCH: 31.5 pg (ref 26.0–34.0)
MCHC: 34.4 g/dL (ref 30.0–36.0)
MCV: 91.4 fL (ref 78.0–100.0)
Platelets: 178 10*3/uL (ref 150–400)
RBC: 3.72 MIL/uL — ABNORMAL LOW (ref 3.87–5.11)
RDW: 13.3 % (ref 11.5–15.5)
WBC: 10.6 10*3/uL — ABNORMAL HIGH (ref 4.0–10.5)

## 2017-05-25 LAB — TYPE AND SCREEN
ABO/RH(D): A POS
Antibody Screen: NEGATIVE

## 2017-05-25 MED ORDER — ONDANSETRON HCL 4 MG/2ML IJ SOLN: 4.0000 mg | Freq: Four times a day (QID) | INTRAMUSCULAR | Status: DC | PRN

## 2017-05-25 MED ORDER — DIPHENHYDRAMINE HCL 50 MG/ML IJ SOLN: 12.5000 mg | INTRAMUSCULAR | Status: DC | PRN

## 2017-05-25 MED ORDER — FENTANYL 2.5 MCG/ML BUPIVACAINE 1/10 % EPIDURAL INFUSION (WH - ANES)
14.0000 mL/h | INTRAMUSCULAR | Status: DC | PRN
  Administered 2017-05-25 (×2): 14 mL/h via EPIDURAL
  Filled 2017-05-25: qty 100

## 2017-05-25 MED ORDER — PHENYLEPHRINE 40 MCG/ML (10ML) SYRINGE FOR IV PUSH (FOR BLOOD PRESSURE SUPPORT)
80.0000 ug | PREFILLED_SYRINGE | INTRAVENOUS | Status: DC | PRN
  Filled 2017-05-25: qty 5
  Filled 2017-05-25: qty 10

## 2017-05-25 MED ORDER — OXYTOCIN BOLUS FROM INFUSION
500.0000 mL | Freq: Once | INTRAVENOUS | Status: AC
  Administered 2017-05-26: 500 mL via INTRAVENOUS

## 2017-05-25 MED ORDER — FLEET ENEMA 7-19 GM/118ML RE ENEM: 1.0000 | ENEMA | RECTAL | Status: DC | PRN

## 2017-05-25 MED ORDER — EPHEDRINE 5 MG/ML INJ: 10.0000 mg | INTRAVENOUS | Status: DC | PRN

## 2017-05-25 MED ORDER — OXYCODONE-ACETAMINOPHEN 5-325 MG PO TABS: 2.0000 | ORAL_TABLET | ORAL | Status: DC | PRN

## 2017-05-25 MED ORDER — PHENYLEPHRINE 40 MCG/ML (10ML) SYRINGE FOR IV PUSH (FOR BLOOD PRESSURE SUPPORT): 80.0000 ug | PREFILLED_SYRINGE | INTRAVENOUS | Status: DC | PRN

## 2017-05-25 MED ORDER — SOD CITRATE-CITRIC ACID 500-334 MG/5ML PO SOLN: 30.0000 mL | ORAL | Status: DC | PRN

## 2017-05-25 MED ORDER — LACTATED RINGERS IV SOLN: 500.0000 mL | Freq: Once | INTRAVENOUS | Status: DC

## 2017-05-25 MED ORDER — OXYCODONE-ACETAMINOPHEN 5-325 MG PO TABS: 1.0000 | ORAL_TABLET | ORAL | Status: DC | PRN

## 2017-05-25 MED ORDER — LIDOCAINE HCL (PF) 1 % IJ SOLN
30.0000 mL | INTRAMUSCULAR | Status: DC | PRN
  Filled 2017-05-25: qty 30

## 2017-05-25 MED ORDER — LACTATED RINGERS IV SOLN
INTRAVENOUS | Status: DC
  Administered 2017-05-25 (×3): via INTRAVENOUS

## 2017-05-25 MED ORDER — EPHEDRINE 5 MG/ML INJ
10.0000 mg | INTRAVENOUS | Status: DC | PRN
  Filled 2017-05-25: qty 2

## 2017-05-25 MED ORDER — ACETAMINOPHEN 325 MG PO TABS: 650.0000 mg | ORAL_TABLET | ORAL | Status: DC | PRN

## 2017-05-25 MED ORDER — PHENYLEPHRINE 40 MCG/ML (10ML) SYRINGE FOR IV PUSH (FOR BLOOD PRESSURE SUPPORT)
80.0000 ug | PREFILLED_SYRINGE | INTRAVENOUS | Status: DC | PRN
  Filled 2017-05-25: qty 5

## 2017-05-25 MED ORDER — LIDOCAINE HCL (PF) 1 % IJ SOLN
INTRAMUSCULAR | Status: DC | PRN
  Administered 2017-05-25 (×2): 4 mL via EPIDURAL

## 2017-05-25 MED ORDER — LACTATED RINGERS IV SOLN
500.0000 mL | Freq: Once | INTRAVENOUS | Status: AC
  Administered 2017-05-25: 500 mL via INTRAVENOUS

## 2017-05-25 MED ORDER — LACTATED RINGERS IV SOLN: 500.0000 mL | INTRAVENOUS | Status: DC | PRN

## 2017-05-25 MED ORDER — FENTANYL 2.5 MCG/ML BUPIVACAINE 1/10 % EPIDURAL INFUSION (WH - ANES): 14.0000 mL/h | INTRAMUSCULAR | Status: DC | PRN

## 2017-05-25 MED ORDER — OXYTOCIN 40 UNITS IN LACTATED RINGERS INFUSION - SIMPLE MED
2.5000 [IU]/h | INTRAVENOUS | Status: DC
  Administered 2017-05-26: 2.5 [IU]/h via INTRAVENOUS
  Filled 2017-05-25: qty 1000

## 2017-05-25 NOTE — H&P (Signed)
Tricia Wallace is a 26 y.o. female presenting for early latent labor  Pregnancy history significant for, Term vaginal Delivery, Preterm 32 week Twins by C/S due to malpresentation (footling breech) NO PTL, IVF pregnancy with short cervical length, no funneling. She has had weekly Bpp's due to IVF. She desires TOLAC. She is on the High Risk list.   OB History    Gravida Para Term Preterm AB Living   3 2 1 1   3    SAB TAB Ectopic Multiple Live Births         1 3     Past Medical History:  Diagnosis Date  . Abnormal Pap smear 04/2008   LEEP  . BV (bacterial vaginosis) 2009  . CIN II (cervical intraepithelial neoplasia II) 2009  . First trimester bleeding 06/07/2009  . GBS carrier   . H/O sciatica   . H/O varicella   . H/O: menorrhagia 2011  . History of preterm delivery, currently pregnant 01/06/2013  . Hx: UTI (urinary tract infection)   . Labial tear 07/04/09  . Marijuana use    History of use  . Periurethral tear in female 07/04/09  . S/P LEEP   . Smoker   . Vaginal Pap smear, abnormal    ok since LEEP   Past Surgical History:  Procedure Laterality Date  . CESAREAN SECTION N/A 01/28/2013   Procedure: CESAREAN SECTION;  Surgeon: Ena Dawley, MD;  Location: [facility];  Service: Obstetrics;  Laterality: N/A;  . DILATION AND CURETTAGE OF UTERUS    . LEEP    . OTHER SURGICAL HISTORY     egg retrieval for IVF  . TONSILLECTOMY    . WISDOM TOOTH EXTRACTION     Family History: family history includes Asthma in her daughter; Cancer in her maternal aunt and maternal grandfather; Diabetes in her brother. Social History:  reports that she quit smoking about 4 years ago. Her smoking use included Cigarettes. She smoked 1.50 packs per day. She has never used smokeless tobacco. She reports that she does not drink alcohol or use drugs.     Maternal Diabetes: No Genetic Screening: Normal Maternal Ultrasounds/Referrals: Normal Fetal Ultrasounds or other Referrals:  Referred to Materal  Fetal Medicine  Maternal Substance Abuse:  No Significant Maternal Medications:  Meds include: Other: 17 P Significant Maternal Lab Results:  None Other Comments:  None  Review of Systems  Gastrointestinal: Positive for abdominal pain, nausea and vomiting.   Maternal Medical History:  Reason for admission: Contractions and nausea.   Contractions: Onset was 3-5 hours ago.   Frequency: regular.   Perceived severity is strong.    Fetal activity: Perceived fetal activity is normal.    Prenatal complications: Preterm labor.     Dilation: 5.5 Effacement (%): 80 Station: -3 Exam by:: [employer], CNM Blood pressure 111/72, pulse 76, temperature 98.3 F (36.8 C), temperature source Oral, resp. rate 18, height 5\' 5"  (1.651 m), weight 147 lb (66.7 kg), currently breastfeeding. Maternal Exam:  Uterine Assessment: Contraction strength is moderate.  Contraction frequency is regular.   Abdomen: Patient reports no abdominal tenderness. Surgical scars: low transverse.   Estimated fetal weight is 7 pounds.   Fetal presentation: vertex  Introitus: Normal vulva. Normal vagina.  Ferning test: not done.  Nitrazine test: not done.  Pelvis: adequate for delivery.   Cervix: Cervix evaluated by digital exam.     Fetal Exam Fetal Monitor Review: Mode: ultrasound.   Variability: moderate (6-25 bpm).   Pattern: accelerations  present.    Fetal State Assessment: Category I - tracings are normal.     Physical Exam  Nursing note and vitals reviewed. Constitutional: She is oriented to person, place, and time. She appears well-developed and well-nourished. No distress.  HENT:  Head: Normocephalic and atraumatic.  Cardiovascular: Normal rate and regular rhythm.   Respiratory: Effort normal and breath sounds normal.  GI: Soft. There is no tenderness.  Genitourinary: Vagina normal.  Musculoskeletal: Normal range of motion.  Neurological: She is alert and oriented to person, place, and time.   Skin: Skin is warm and dry.  Psychiatric: She has a normal mood and affect. Her behavior is normal. Judgment and thought content normal.    Prenatal labs: ABO, Rh: --/--/A POS (06/03 1030) Antibody: NEG (06/03 1030) Rubella:  Immune RPR:   NR HBsAg:   Neg HIV:   Neg GBS:   Negative  Assessment/Plan: Early Latent Labor Tolac IUP @ 38+5  Admit Intermittent Monitoring Epidural when desired Expectant Management Anticipate Vaginal delivery   A   CNM 05/25/17 0930 AM

## 2017-05-25 NOTE — MAU Note (Signed)
Membranes swept last wk, was 3/80, lost mucous plug.  Nauseated, was sick yesterday.  Started contracting at 0330. Feeling pain in vagina with contractions.  Prior c/s at 32 wks with twin preg, plans for vbac

## 2017-05-25 NOTE — Anesthesia Preprocedure Evaluation (Addendum)
Anesthesia Evaluation  Patient identified by MRN, date of birth, ID band Patient awake    Reviewed: Allergy & Precautions, Patient's Chart, lab work & pertinent test results  Airway Mallampati: II  TM Distance: >3 FB Neck ROM: Full    Dental no notable dental hx. (+) Teeth Intact   Pulmonary former smoker,    Pulmonary exam normal breath sounds clear to auscultation       Cardiovascular Normal cardiovascular exam Rhythm:Regular Rate:Normal     Neuro/Psych negative neurological ROS  negative psych ROS   GI/Hepatic Neg liver ROS,   Endo/Other  negative endocrine ROS  Renal/GU negative Renal ROS  negative genitourinary   Musculoskeletal Hx/o Sciatica Scoliosis   Abdominal   Peds  Hematology  (+) anemia ,   Anesthesia Other Findings   Reproductive/Obstetrics (+) Pregnancy Previous C/Section IVF pregnancy                             Anesthesia Physical Anesthesia Plan  ASA: II  Anesthesia Plan: Epidural   Post-op Pain Management:    Induction:   Airway Management Planned: Natural Airway  Additional Equipment:   Intra-op Plan:   Post-operative Plan:   Informed Consent: I have reviewed the patients History and Physical, chart, labs and discussed the procedure including the risks, benefits and alternatives for the proposed anesthesia with the patient or authorized representative who has indicated his/her understanding and acceptance.     Plan Discussed with: Anesthesiologist  Anesthesia Plan Comments:         Anesthesia Quick Evaluation

## 2017-05-25 NOTE — Anesthesia Pain Management Evaluation Note (Signed)
  CRNA Pain Management Visit Note  Patient: Tricia Wallace, 26 y.o., female  "Hello I am a member of the anesthesia team at [facility]. We have an anesthesia team available at all times to provide care throughout the hospital, including epidural management and anesthesia for C-section. I don't know your plan for the delivery whether it a natural birth, water birth, IV sedation, nitrous supplementation, doula or epidural, but we want to meet your pain goals."   1.Was your pain managed to your expectations on prior hospitalizations?   Yes   2.What is your expectation for pain management during this hospitalization?     Epidural  3.How can we help you reach that goal? unsure  Record the patient's initial score and the patient's pain goal.   Pain: 6  Pain Goal: 8 [facility] wants you to be able to say your pain was always managed very well.  Casimer Lanius 05/25/2017

## 2017-05-25 NOTE — Anesthesia Procedure Notes (Signed)
Epidural Patient location during procedure: OB Start time: 05/25/2017 9:52 PM  Staffing Anesthesiologist: Josephine Igo Performed: anesthesiologist   Preanesthetic Checklist Completed: patient identified, site marked, surgical consent, pre-op evaluation, timeout performed, IV checked, risks and benefits discussed and monitors and equipment checked  Epidural Patient position: sitting Prep: site prepped and draped and DuraPrep Patient monitoring: continuous pulse ox and blood pressure Approach: midline Location: L4-L5 Injection technique: LOR air  Needle:  Needle type: Tuohy  Needle gauge: 17 G Needle length: 9 cm and 9 Needle insertion depth: 5 cm cm Catheter type: closed end flexible Catheter size: 19 Gauge Catheter at skin depth: 10 cm Test dose: negative and Other  Assessment Events: blood not aspirated, injection not painful, no injection resistance, negative IV test and no paresthesia  Additional Notes Patient identified. Risks and benefits discussed including failed block, incomplete  Pain control, post dural puncture headache, nerve damage, paralysis, blood pressure Changes, nausea, vomiting, reactions to medications-both toxic and allergic and post Partum back pain. All questions were answered. Patient expressed understanding and wished to proceed. Sterile technique was used throughout procedure. Epidural site was Dressed with sterile barrier dressing. No paresthesias, signs of intravascular injection Or signs of intrathecal spread were encountered.  Patient was more comfortable after the epidural was dosed. Please see RN's note for documentation of vital signs and FHR which are stable.

## 2017-05-25 NOTE — Progress Notes (Signed)
LABOR PROGRESS NOTE  Tricia Wallace is a 26 y.o. [phone] at [redacted]w[redacted]d  admitted for Labor Previous vaginal delivery x 1 and previous C/S x 1 for twins 4 years ago.   Subjective: In MAU, pt was checked and exam was 5-6 cm. Contracting every 2-3 minutes. On my exam she is 4 cm/60/-2. Not a loaboring cervix. Contractions have spaced out and pt states she is not hurting enough to have an epidural. Explained to pt that I would not augment her or break her water at this point. She would have to labor and change her cervix on her own. Encouraged her to walk.   Objective: BP 138/84   Pulse 87   Temp 98.3 F (36.8 C) (Oral)   Resp 18   Ht 5\' 5"  (1.651 m)   Wt 147 lb (66.7 kg)   BMI 24.46 kg/m  or  Vitals:   05/25/17 0804 05/25/17 0930  BP: 121/84 138/84  Pulse: 73 87  Resp: 18 18  Temp: 98 F (36.7 C) 98.3 F (36.8 C)  TempSrc: Oral Oral  Weight:  147 lb (66.7 kg)  Height:  5\' 5"  (1.651 m)    Dilation: 4 Effacement (%): 60 Cervical Position: Posterior Station: -2 Presentation: Vertex Exam by:: Therese Sarah, RN  Labs: Lab Results  Component Value Date   WBC 10.6 (H) 05/25/2017   HGB 11.7 (L) 05/25/2017   HCT 34.0 (L) 05/25/2017   MCV 91.4 05/25/2017   PLT 178 05/25/2017    Patient Active Problem List   Diagnosis Date Noted  . Normal labor 05/25/2017  . Preterm delivery of twins by C/S 01/29/2013  . History of preterm delivery, currently pregnant 01/06/2013  . Short cervix affecting pregnancy 12/27/2012  . Flu vaccine need 10/08/2012  . Hyperemesis complicating pregnancy, antepartum 10/08/2012  . History of cervical LEEP biopsy affecting care of mother, antepartum 09/12/2012    Assessment / Plan: IUP @ 38+5 GBS negative VBAC Labor: early Fetal Wellbeing:  Cat 1 Pain Control:  No medications at this point Anticipated MOD:  SVD Light laboring diet Intermittent monitoring Walk  Lori A Clemmons 05/25/2017, 12:00 PM

## 2017-05-25 NOTE — Progress Notes (Signed)
LABOR PROGRESS NOTE  Tricia Wallace is a 26 y.o. [phone] at [redacted]w[redacted]d  admitted for latent early labor  Subjective: Contractions have spaced out but cervical change noted since exam earlier today. She is now 5-6/80/BBOW. I gave her the option to stay or go home and she chooses to stay. No augmentation was discussed.  If no cervical change in AM pt will be discharged. She has an appointment at 1000am for Bpp. Discussed POC with Dr. Charlesetta Garibaldi.  Objective: BP 111/72   Pulse 76   Temp 98.3 F (36.8 C) (Oral)   Resp 18   Ht 5\' 5"  (1.651 m)   Wt 147 lb (66.7 kg)   BMI 24.46 kg/m  or  Vitals:   05/25/17 0804 05/25/17 0930 05/25/17 1402  BP: 121/84 138/84 111/72  Pulse: 73 87 76  Resp: 18 18 18   Temp: 98 F (36.7 C) 98.3 F (36.8 C)   TempSrc: Oral Oral   Weight:  147 lb (66.7 kg)   Height:  5\' 5"  (1.651 m)      Dilation: 5.5 Effacement (%): 80 Cervical Position: Posterior Station: -3 Presentation: Vertex Exam by:: Yvonne Kendall, CNM  Labs: Lab Results  Component Value Date   WBC 10.6 (H) 05/25/2017   HGB 11.7 (L) 05/25/2017   HCT 34.0 (L) 05/25/2017   MCV 91.4 05/25/2017   PLT 178 05/25/2017    Patient Active Problem List   Diagnosis Date Noted  . Normal labor 05/25/2017  . Preterm delivery of twins by C/S 01/29/2013  . History of preterm delivery, currently pregnant 01/06/2013  . Short cervix affecting pregnancy 12/27/2012  . Flu vaccine need 10/08/2012  . Hyperemesis complicating pregnancy, antepartum 10/08/2012  . History of cervical LEEP biopsy affecting care of mother, antepartum 09/12/2012    Assessment / Plan:  26 y.o. G3P1103 at [redacted]w[redacted]d  Labor: Early Latent Fetal Wellbeing:  Cat 1 Pain Control:  None at this time Anticipated MOD:  SVD  Efstathios Sawin A Yanelli Zapanta 05/25/2017, 5:20 PM

## 2017-05-26 ENCOUNTER — Encounter (HOSPITAL_COMMUNITY): Payer: Self-pay | Admitting: *Deleted

## 2017-05-26 DIAGNOSIS — O34219 Maternal care for unspecified type scar from previous cesarean delivery: Secondary | ICD-10-CM

## 2017-05-26 DIAGNOSIS — Z9851 Tubal ligation status: Secondary | ICD-10-CM

## 2017-05-26 LAB — CBC
HCT: 28.7 % — ABNORMAL LOW (ref 36.0–46.0)
Hemoglobin: 10.2 g/dL — ABNORMAL LOW (ref 12.0–15.0)
MCH: 32.1 pg (ref 26.0–34.0)
MCHC: 35.5 g/dL (ref 30.0–36.0)
MCV: 90.3 fL (ref 78.0–100.0)
Platelets: 147 10*3/uL — ABNORMAL LOW (ref 150–400)
RBC: 3.18 MIL/uL — ABNORMAL LOW (ref 3.87–5.11)
RDW: 13.2 % (ref 11.5–15.5)
WBC: 14.9 10*3/uL — ABNORMAL HIGH (ref 4.0–10.5)

## 2017-05-26 LAB — RPR: RPR Ser Ql: NONREACTIVE

## 2017-05-26 MED ORDER — COCONUT OIL OIL
1.0000 "application " | TOPICAL_OIL | Status: DC | PRN
  Administered 2017-05-26: 1 via TOPICAL
  Filled 2017-05-26: qty 120

## 2017-05-26 MED ORDER — ZOLPIDEM TARTRATE 5 MG PO TABS: 5.0000 mg | ORAL_TABLET | Freq: Every evening | ORAL | Status: DC | PRN

## 2017-05-26 MED ORDER — METHYLERGONOVINE MALEATE 0.2 MG PO TABS: 0.2000 mg | ORAL_TABLET | ORAL | Status: DC | PRN

## 2017-05-26 MED ORDER — BENZOCAINE-MENTHOL 20-0.5 % EX AERO
1.0000 "application " | INHALATION_SPRAY | CUTANEOUS | Status: DC | PRN
  Administered 2017-05-26: 1 via TOPICAL
  Filled 2017-05-26: qty 56

## 2017-05-26 MED ORDER — ACETAMINOPHEN 325 MG PO TABS: 650.0000 mg | ORAL_TABLET | ORAL | Status: DC | PRN

## 2017-05-26 MED ORDER — DIPHENHYDRAMINE HCL 25 MG PO CAPS: 25.0000 mg | ORAL_CAPSULE | Freq: Four times a day (QID) | ORAL | Status: DC | PRN

## 2017-05-26 MED ORDER — FERROUS SULFATE 325 (65 FE) MG PO TABS
325.0000 mg | ORAL_TABLET | Freq: Two times a day (BID) | ORAL | Status: DC
  Administered 2017-05-26 (×2): 325 mg via ORAL
  Filled 2017-05-26 (×2): qty 1

## 2017-05-26 MED ORDER — IBUPROFEN 600 MG PO TABS
600.0000 mg | ORAL_TABLET | Freq: Four times a day (QID) | ORAL | Status: DC
  Administered 2017-05-26 – 2017-05-27 (×5): 600 mg via ORAL
  Filled 2017-05-26 (×5): qty 1

## 2017-05-26 MED ORDER — SENNOSIDES-DOCUSATE SODIUM 8.6-50 MG PO TABS
2.0000 | ORAL_TABLET | ORAL | Status: DC
  Administered 2017-05-26: 2 via ORAL
  Filled 2017-05-26: qty 2

## 2017-05-26 MED ORDER — PRENATAL MULTIVITAMIN CH: 1.0000 | ORAL_TABLET | Freq: Every day | ORAL | Status: DC

## 2017-05-26 MED ORDER — METHYLERGONOVINE MALEATE 0.2 MG/ML IJ SOLN: 0.2000 mg | INTRAMUSCULAR | Status: DC | PRN

## 2017-05-26 MED ORDER — DIBUCAINE 1 % RE OINT
1.0000 "application " | TOPICAL_OINTMENT | RECTAL | Status: DC | PRN
  Administered 2017-05-26: 1 via RECTAL
  Filled 2017-05-26: qty 28

## 2017-05-26 MED ORDER — ONDANSETRON HCL 4 MG/2ML IJ SOLN: 4.0000 mg | INTRAMUSCULAR | Status: DC | PRN

## 2017-05-26 MED ORDER — SIMETHICONE 80 MG PO CHEW: 80.0000 mg | CHEWABLE_TABLET | ORAL | Status: DC | PRN

## 2017-05-26 MED ORDER — ONDANSETRON HCL 4 MG PO TABS: 4.0000 mg | ORAL_TABLET | ORAL | Status: DC | PRN

## 2017-05-26 MED ORDER — WITCH HAZEL-GLYCERIN EX PADS: 1.0000 "application " | MEDICATED_PAD | CUTANEOUS | Status: DC | PRN

## 2017-05-26 NOTE — Anesthesia Postprocedure Evaluation (Signed)
Anesthesia Post Note  Patient: Sparkman  Procedure(s) Performed: * No procedures listed *     Patient location during evaluation: Mother Baby Anesthesia Type: Epidural Level of consciousness: awake, awake and alert, oriented and patient cooperative Pain management: pain level controlled Vital Signs Assessment: post-procedure vital signs reviewed and stable Respiratory status: spontaneous breathing, nonlabored ventilation and respiratory function stable Cardiovascular status: stable Postop Assessment: no headache, no backache, patient able to bend at knees and no signs of nausea or vomiting Anesthetic complications: no    Last Vitals:  Vitals:   05/26/17 0150 05/26/17 0300  BP: 108/62 108/69  Pulse: 69 72  Resp: 18 18  Temp: 37.1 C     Last Pain:  Vitals:   05/26/17 0116  TempSrc:   PainSc: 0-No pain   Pain Goal:                 Thomos Domine L

## 2017-05-26 NOTE — Lactation Note (Signed)
This note was copied from a baby's chart. Lactation Consultation Note  Patient Name: Tricia Wallace Today's Date: 05/26/2017   Baby < 6 lbs. 10 hours old.  Mother breastfed and pumped w/ her twins and other child for approx 1 year. Baby latched upon entering in laid back position on R breast. Mother states her nipples are sore. Unlatched baby and mother has blister on R nipple.  She has been given coconut oil Provided mother with comfort gels. Noted baby keeps tongue on roof of mouth and mother feels tongue hitting nipple during feeding. Demonstrated how to do suck training.  Recommend mother compress breast when latching for deep latch. Due to weight, suggest mother breastfeed q 3 hours. Discussed that if baby become sleepy at feedings a DEBP would be set up to supplement w/ breastmilk. Mom encouraged to feed baby 8-12 times/24 hours and with feeding cues at least q3 hours.  Mom made aware of O/P services, breastfeeding support groups, community resources, and our phone # for post-discharge questions.      Maternal Data    Feeding    LATCH Score/Interventions                      Lactation Tools Discussed/Used     Consult Status      Carlye Grippe 05/26/2017, 11:13 AM

## 2017-05-27 NOTE — Lactation Note (Addendum)
This note was copied from a baby's chart. Lactation Consultation Note  Baby 31 hours old < 6 lbs. 4% Mother has been feeding previously q1-1.5 hours by waking her up and now mother is stating baby is getting very sleepy at feedings. Recommend baby sleepy longer to conserve energy. Pacifier use not recommended at this time.  Baby latched in football.  Sucks observed and swallows heard. Encouraged mother to compress breast during feeding. Mother's nipple soreness has improved but are still pink. She is using coconut oil and has comfort gels. Breastfeed with feeding cues and at least hours. q 3hours.  Mother is allowing her to sleep longer now about every 2 hours. Discussed if baby becomes sleepy at the breast to post pump 4-6 times per day for 10-15 min. Give baby back volume pumped.  Reviewed foley cup use. Reviewed engorgement care and monitoring voids/stools. Mom encouraged to feed baby 8-12 times/24 hours and with feeding cues at least q 3 hours.    Patient Name: Tricia Wallace Date: 05/27/2017 Reason for consult: Follow-up assessment   Maternal Data    Feeding Feeding Type: Breast Fed  LATCH Score/Interventions Latch:  (not called to asses LATCH 2300-0700)                    Lactation Tools Discussed/Used     Consult Status Consult Status: Follow-up Date: 05/28/17 Follow-up type: In-patient    Tricia Wallace [facility] 05/27/2017, 9:28 AM

## 2017-05-27 NOTE — Discharge Summary (Signed)
OB Discharge Summary     Patient Name: Tricia Wallace DOB: [birth year] MRN: [id number]  Date of admission: 05/25/2017 Delivering MD: Farrel Gordon   Date of discharge: 05/27/2017  Admitting diagnosis: 38wks labor eval Intrauterine pregnancy: [redacted]w[redacted]d     Secondary diagnosis:  Principal Problem:   VBAC, delivered, current hospitalization Active Problems:   Newborn product of in vitro fertilization (IVF) pregnancy   History of bilateral tubal ligation  Additional problems: None     Discharge diagnosis: Term Pregnancy Delivered                                                                                                Post partum procedures:N/A  Augmentation: None  Complications: None  Hospital course:  Onset of Labor With Vaginal Delivery     26 y.o. yo [id number] at [redacted]w[redacted]d was admitted in Latent Labor on 05/25/2017. Patient had an uncomplicated labor course as follows:  Membrane Rupture Time/Date: 11:53 PM ,05/25/2017   Intrapartum Procedures: Episiotomy: None [1]                                         Lacerations:  None [1]  Patient had a delivery of a Viable infant. 05/26/2017  Information for the patient's newborn:  Briceyda, Abdullah [[id number]]  Delivery Method: Vaginal, Spontaneous Delivery (Filed from Delivery Summary)    Pateint had an uncomplicated postpartum course.  She is ambulating, tolerating a regular diet, passing flatus, and urinating well. Patient is discharged home in stable condition on 05/27/17.   Physical exam  Vitals:   05/26/17 0300 05/26/17 0730 05/26/17 1845 05/27/17 0554  BP: 108/69 105/65 102/65 116/73  Pulse: 72 73 81 65  Resp: 18 16 18 18   Temp:  98.3 F (36.8 C) 98.4 F (36.9 C) 98.6 F (37 C)  TempSrc:  Axillary Oral Oral  SpO2:      Weight:      Height:       General: alert, cooperative and no distress Lochia: appropriate Uterine Fundus: firm Incision: N/A DVT Evaluation: No evidence of DVT seen on physical exam. Negative  Homan's sign. Labs: Lab Results  Component Value Date   WBC 14.9 (H) 05/26/2017   HGB 10.2 (L) 05/26/2017   HCT 28.7 (L) 05/26/2017   MCV 90.3 05/26/2017   PLT 147 (L) 05/26/2017   CMP Latest Ref Rng & Units 02/20/2013  Glucose 70 - 99 mg/dL 113(H)  BUN 6 - 23 mg/dL 12  Creatinine 0.50 - 1.10 mg/dL 0.77  Sodium 135 - 145 mEq/L 137  Potassium 3.5 - 5.1 mEq/L 3.4(L)  Chloride 96 - 112 mEq/L 98  CO2 19 - 32 mEq/L 26  Calcium 8.4 - 10.5 mg/dL 9.2  Total Protein 6.0 - 8.3 g/dL 7.9  Total Bilirubin 0.3 - 1.2 mg/dL 0.4  Alkaline Phos 39 - 117 U/L 101  AST 0 - 37 U/L 21  ALT 0 - 35 U/L 19    Discharge instruction: per After Visit Summary and "Baby  and Me Booklet".  After visit meds:  Allergies as of 05/27/2017   No Known Allergies     Medication List    STOP taking these medications   hydroxyprogesterone caproate 250 mg/mL Oil injection Commonly known as:  MAKENA   ondansetron 8 MG tablet Commonly known as:  ZOFRAN   promethazine 25 MG tablet Commonly known as:  PHENERGAN       Diet: routine diet  Activity: Advance as tolerated. Pelvic rest for 6 weeks.   Outpatient follow up:6 weeks Follow up Appt:No future appointments. Follow up Visit:No Follow-up on file.  Postpartum contraception: Tubal Ligation  Newborn Data: Live born female  Birth Weight: 5 lb 8.7 oz (2515 g) APGAR: 9, 9  Baby Feeding: Breast Disposition:home with mother   05/27/2017 Starla Link, CNM

## 2017-07-08 DIAGNOSIS — Z124 Encounter for screening for malignant neoplasm of cervix: Secondary | ICD-10-CM | POA: Diagnosis not present

## 2017-07-08 DIAGNOSIS — N898 Other specified noninflammatory disorders of vagina: Secondary | ICD-10-CM | POA: Diagnosis not present

## 2017-10-11 ENCOUNTER — Emergency Department (HOSPITAL_BASED_OUTPATIENT_CLINIC_OR_DEPARTMENT_OTHER)
Admission: EM | Admit: 2017-10-11 | Discharge: 2017-10-11 | Disposition: A | Discharge status: Home / Self Care | Payer: 59 | Attending: Emergency Medicine | Admitting: Emergency Medicine

## 2017-10-11 ENCOUNTER — Encounter (HOSPITAL_BASED_OUTPATIENT_CLINIC_OR_DEPARTMENT_OTHER): Payer: Self-pay | Admitting: Emergency Medicine

## 2017-10-11 ENCOUNTER — Emergency Department (HOSPITAL_BASED_OUTPATIENT_CLINIC_OR_DEPARTMENT_OTHER): Payer: 59

## 2017-10-11 DIAGNOSIS — M62838 Other muscle spasm: Secondary | ICD-10-CM | POA: Diagnosis not present

## 2017-10-11 DIAGNOSIS — M25512 Pain in left shoulder: Secondary | ICD-10-CM | POA: Diagnosis present

## 2017-10-11 DIAGNOSIS — M7918 Myalgia, other site: Secondary | ICD-10-CM | POA: Diagnosis not present

## 2017-10-11 DIAGNOSIS — M791 Myalgia, unspecified site: Secondary | ICD-10-CM | POA: Diagnosis not present

## 2017-10-11 DIAGNOSIS — Z87891 Personal history of nicotine dependence: Secondary | ICD-10-CM | POA: Insufficient documentation

## 2017-10-11 DIAGNOSIS — S4992XA Unspecified injury of left shoulder and upper arm, initial encounter: Secondary | ICD-10-CM | POA: Diagnosis not present

## 2017-10-11 DIAGNOSIS — G2582 Stiff-man syndrome: Secondary | ICD-10-CM | POA: Diagnosis not present

## 2017-10-11 MED ORDER — IBUPROFEN 800 MG PO TABS: 800.0000 mg | ORAL_TABLET | Freq: Once | ORAL | Status: DC

## 2017-10-11 MED ORDER — CYCLOBENZAPRINE HCL 5 MG PO TABS: 5.0000 mg | ORAL_TABLET | Freq: Once | ORAL | Status: DC

## 2017-10-11 NOTE — Discharge Instructions (Signed)
Your x-ray show no fracture, dislocation, or acute bone or joint abnormality today. Ice, or ice massage to the area.  Over-the-counter Motrin or Tylenol as needed.
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
## 2017-10-11 NOTE — ED Notes (Signed)
Patient is A & O x4.  She understood AVS discharge instructions.
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
## 2018-01-06 ENCOUNTER — Encounter: Payer: Self-pay | Admitting: Nurse Practitioner

## 2018-01-06 ENCOUNTER — Ambulatory Visit (INDEPENDENT_AMBULATORY_CARE_PROVIDER_SITE_OTHER): Payer: 59 | Admitting: Nurse Practitioner

## 2018-01-06 VITALS — BP 102/72 | HR 92 | Temp 97.5°F | Ht 65.0 in | Wt 138.0 lb

## 2018-01-06 DIAGNOSIS — R350 Frequency of micturition: Secondary | ICD-10-CM | POA: Diagnosis not present

## 2018-01-06 DIAGNOSIS — N3001 Acute cystitis with hematuria: Secondary | ICD-10-CM | POA: Diagnosis not present

## 2018-01-06 LAB — POCT URINALYSIS DIPSTICK
Glucose, UA: NEGATIVE
Leukocytes, UA: NEGATIVE
Nitrite, UA: POSITIVE
Spec Grav, UA: >=1.03 — AB (ref 1.010–1.025)
Urobilinogen, UA: 0.2 E.U./dL
pH, UA: 5.5 (ref 5.0–8.0)

## 2018-01-06 MED ORDER — CEPHALEXIN 500 MG PO CAPS: 500.0000 mg | ORAL_CAPSULE | Freq: Three times a day (TID) | ORAL | 0 refills | Status: DC

## 2018-01-06 MED ORDER — CEPHALEXIN 500 MG PO CAPS: 500.0000 mg | ORAL_CAPSULE | Freq: Two times a day (BID) | ORAL | 0 refills | Status: DC

## 2018-01-06 MED ORDER — SACCHAROMYCES BOULARDII 250 MG PO CAPS: 250.0000 mg | ORAL_CAPSULE | Freq: Two times a day (BID) | ORAL | Status: DC
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
## 2018-01-06 NOTE — Patient Instructions (Signed)
You will be contacted with urine culture results.  Push oral hydration.

## 2018-01-09 LAB — URINE CULTURE
MICRO NUMBER:: 90060230
SPECIMEN QUALITY:: ADEQUATE

## 2018-01-16 ENCOUNTER — Ambulatory Visit (INDEPENDENT_AMBULATORY_CARE_PROVIDER_SITE_OTHER): Payer: 59 | Admitting: Nurse Practitioner

## 2018-01-16 ENCOUNTER — Encounter: Payer: Self-pay | Admitting: Nurse Practitioner

## 2018-01-16 VITALS — BP 100/74 | HR 81 | Temp 98.0°F | Ht 65.0 in | Wt 146.0 lb

## 2018-01-16 DIAGNOSIS — Z713 Dietary counseling and surveillance: Secondary | ICD-10-CM | POA: Diagnosis not present

## 2018-01-16 DIAGNOSIS — Z0001 Encounter for general adult medical examination with abnormal findings: Secondary | ICD-10-CM

## 2018-01-16 DIAGNOSIS — R300 Dysuria: Secondary | ICD-10-CM

## 2018-01-16 DIAGNOSIS — Z13220 Encounter for screening for lipoid disorders: Secondary | ICD-10-CM | POA: Diagnosis not present

## 2018-01-16 DIAGNOSIS — Z136 Encounter for screening for cardiovascular disorders: Secondary | ICD-10-CM

## 2018-01-16 NOTE — Patient Instructions (Addendum)
Urine is negative for bacteria. No additional oral abx needed at this time. Normal cbc, tsh, cmp, and lipid panel.  I recommended to continue regualr exercise and heart healthy diet to help maintain weight.  Weaning When you stop breastfeeding, it is called weaning. This can be a natural process that takes place on its own over time. There may be a reason you need to stop breastfeeding before it can happen naturally (such as you may need to go back to work, be away from home on a trip, or you feel it is the right time). If possible, wait until your baby is at least 76 months old. With a little time and preparation, weaning can be a positive experience. When is the best time to stop breastfeeding? There is no right or wrong answer. What is best for you and your child may be different from what is best for other mothers and their children. It is recommended to:  Feed your baby only breast milk for the first 6 months.  Feed your baby both breast milk and solid food for another 6 months.  Continue to breastfeed your baby as long as both you and your child desire after 12 months.  How do I start weaning? Wean gradually over several weeks. Some guidelines to follow are:  Start by introducing iron-fortified, solid food in addition to breast milk.  Encourage your baby to try different feeding methods.  Teach your baby to drink from a cup. Try putting pre-pumped (expressed) breast milk in the cup.  If your baby will not use a cup, try offering a bottle.  It may be easier and less confusing for your baby if someone else offers the first cup or bottle feeding.  When cup or bottle feeding is successful, and your baby is getting enough nutrition that way, you can substitute a cup feeding for a breastfeeding session.  You can eventually substitute a cup feeding for another breastfeeding session, especially if your baby will drink something besides your expressed breast milk.  Continue replacing  one more breastfeeding session every few days.  Your breasts may feel full and uncomfortable at times during weaning. Express just a small amount of milk for relief.  How does breastfeeding stop naturally? Children may start to wean themselves at about 6 months. This may happen when:  You introduce solid food. Your baby may still prefer to nurse in order to get fluids.  Your baby is better able to drink from a cup. This may prompt your baby to breastfeed less often.  Your baby gradually becomes less interested in breastfeeding as he or she gets used to drinking other fluids.  Your baby slowly starts to drop one breastfeeding session every 2-3 days.  When should I get help with weaning? Your health care provider or a lactation consultant can help you during the weaning process. They are good resources to ask which foods are best to introduce first and which fluids you can start substituting for breast milk. They can also help if you are having any problems related to weaning. When should I contact my health care provider? You should contact your health care provider if:  Your baby is not gaining weight.  You baby suddenly stops nursing.  One or both breasts become firm and painful.  This information is not intended to replace advice given to you by your health care provider. Make sure you discuss any questions you have with your health care provider. Document Released: 04/09/2005 Document Revised:  05/16/2016 Document Reviewed: 10/05/2013 Elsevier Interactive Patient Education  [employer].

## 2018-01-16 NOTE — Progress Notes (Signed)
Subjective:    Patient ID: Tricia Wallace, female    DOB: 11-Mar-1991, 27 y.o.   MRN: [id number]  Patient presents today for complete physical  Dysuria   This is a new problem. The current episode started 1 to 4 weeks ago. The problem occurs every urination. The problem has been unchanged. The quality of the pain is described as burning. The patient is experiencing no pain. There has been no fever. She is sexually active. There is no history of pyelonephritis. Pertinent negatives include no frequency, hesitancy or urgency. She has tried antibiotics and increased fluids for the symptoms. There is no history of recurrent UTIs, urinary stasis or a urological procedure.   She is concerned about weight gain after child birth. Has not been able to maintain or loss weight with diet and exercise. She is unsure how to wean daughter from breastfeeding so she can focus on weight loss. Her daughter is 19months old. Weight in 2016: 128Lbs. Wt Readings from Last 3 Encounters:  01/16/18 146 lb (66.2 kg)  01/06/18 138 lb (62.6 kg)  05/25/17 147 lb (66.7 kg)   Immunizations: (TDAP, Hep C screen, Pneumovax, Influenza, zoster)  Health Maintenance  Topic Date Due  . Pap Smear  11/19/2015  . Tetanus Vaccine  01/30/2023  . Flu Shot  Completed  . HIV Screening  Completed   Diet:regular.  Weight:  Wt Readings from Last 3 Encounters:  01/16/18 146 lb (66.2 kg)  01/06/18 138 lb (62.6 kg)  05/25/17 147 lb (66.7 kg)   Exercise:treadmil and elliptical 3x/week.  Fall Risk: Fall Risk  01/06/2018 12/13/2016 12/03/2016  Falls in the past year? No No No   Home Safety:home with husband and children.  Depression/Suicide: Depression screen [facility] 2/9 01/06/2018  Decreased Interest 0  PHQ - 2 Score 0    Pap Smear (every 15yrs for >21-29 without HPV, every 84yrs for >30-87yrs with HPV):Dr. Rivart at [place].  Vision:up to date  Dental:up to date  Advanced Directive: Advanced Directives  10/11/2017  Does Patient Have a Medical Advance Directive? No  Would patient like information on creating a medical advance directive? No - Patient declined  Pre-existing out of facility DNR order (yellow form or pink MOST form) -   Sexual History (birth control, marital status, STD):married, sexually active, current breastfeeding.  Medications and allergies reviewed with patient and updated if appropriate.  Patient Active Problem List   Diagnosis Date Noted  . Newborn product of in vitro fertilization (IVF) pregnancy 05/26/2017  . VBAC, delivered, current hospitalization 05/26/2017  . History of bilateral tubal ligation 05/26/2017  . Preterm delivery of twins by C/S 01/29/2013  . History of cervical LEEP biopsy affecting care of mother, antepartum 09/12/2012    No current outpatient medications on file prior to visit.   No current facility-administered medications on file prior to visit.     Past Medical History:  Diagnosis Date  . Abnormal Pap smear 04/2008   LEEP  . BV (bacterial vaginosis) 2009  . CIN II (cervical intraepithelial neoplasia II) 2009  . First trimester bleeding 06/07/2009  . GBS carrier   . H/O sciatica   . H/O varicella   . H/O: menorrhagia 2011  . History of preterm delivery, currently pregnant 01/06/2013  . Hx: UTI (urinary tract infection)   . Labial tear 07/04/09  . Marijuana use    History of use  . Periurethral tear in female 07/04/09  . S/P LEEP   . Smoker   .  Vaginal Pap smear, abnormal    ok since LEEP    Past Surgical History:  Procedure Laterality Date  . CESAREAN SECTION N/A 01/28/2013   Procedure: CESAREAN SECTION;  Surgeon: Ena Dawley, MD;  Location: [facility];  Service: Obstetrics;  Laterality: N/A;  . DILATION AND CURETTAGE OF UTERUS    . LEEP    . OTHER SURGICAL HISTORY     egg retrieval for IVF  . TONSILLECTOMY    . WISDOM TOOTH EXTRACTION      Social History   Socioeconomic History  . Marital status: Married    Spouse  name: None  . Number of children: None  . Years of education: None  . Highest education level: None  Social Needs  . Financial resource strain: None  . Food insecurity - worry: None  . Food insecurity - inability: None  . Transportation needs - medical: None  . Transportation needs - non-medical: None  Occupational History  . None  Tobacco Use  . Smoking status: Former Smoker    Packs/day: 1.50    Types: Cigarettes    Last attempt to quit: 01/02/2013    Years since quitting: 5.0  . Smokeless tobacco: Never Used  Substance and Sexual Activity  . Alcohol use: No  . Drug use: No  . Sexual activity: Yes    Birth control/protection: None  Other Topics Concern  . None  Social History Narrative  . None    Family History  Problem Relation Age of Onset  . Diabetes Brother   . Cancer Maternal Grandfather        Lung  . Cancer Maternal Aunt 23       breast  . Asthma Daughter         Review of Systems  Constitutional: Negative for fever, malaise/fatigue and weight loss.  HENT: Negative for congestion and sore throat.   Eyes:       Negative for visual changes  Respiratory: Negative for cough and shortness of breath.   Cardiovascular: Negative for chest pain, palpitations and leg swelling.  Gastrointestinal: Negative for blood in stool, constipation, diarrhea and heartburn.  Genitourinary: Positive for dysuria. Negative for frequency, hesitancy and urgency.  Musculoskeletal: Negative for falls, joint pain and myalgias.  Skin: Negative for rash.  Neurological: Negative for dizziness, sensory change and headaches.  Endo/Heme/Allergies: Does not bruise/bleed easily.  Psychiatric/Behavioral: Negative for depression, substance abuse and suicidal ideas. The patient is not nervous/anxious.     Objective:   Vitals:   01/16/18 1527  BP: 100/74  Pulse: 81  Temp: 98 F (36.7 C)  SpO2: 99%    Body mass index is 24.3 kg/m.   Physical Examination:  Physical Exam    Constitutional: She is oriented to person, place, and time and well-developed, well-nourished, and in no distress. No distress.  HENT:  Right Ear: External ear normal.  Left Ear: External ear normal.  Nose: Nose normal.  Mouth/Throat: Oropharynx is clear and moist. No oropharyngeal exudate.  Eyes: Conjunctivae and EOM are normal. Pupils are equal, round, and reactive to light. No scleral icterus.  Neck: Normal range of motion. Neck supple. No thyromegaly present.  Cardiovascular: Normal rate, normal heart sounds and intact distal pulses.  Pulmonary/Chest: Effort normal and breath sounds normal. She exhibits no tenderness.  Abdominal: Soft. Bowel sounds are normal. She exhibits no distension. There is no tenderness.  Musculoskeletal: Normal range of motion. She exhibits no edema or tenderness.  Lymphadenopathy:    She has no cervical  adenopathy.  Neurological: She is alert and oriented to person, place, and time. Gait normal.  Skin: Skin is warm and dry.  Psychiatric: Affect and judgment normal.    ASSESSMENT and PLAN:  Tricia Wallace was seen today for establish care and urinary tract infection.  Diagnoses and all orders for this visit:  Encounter for preventative adult health care exam with abnormal findings -     CBC w/Diff -     TSH -     Comprehensive metabolic panel -     Lipid panel  Dysuria -     Urinalysis w microscopic + reflex cultur  Encounter for lipid screening for cardiovascular disease -     Lipid panel  Encounter for weight loss counseling  Other orders -     REFLEXIVE URINE CULTURE -     Urine Culture   No problem-specific Assessment & Plan notes found for this encounter.    Today's visit lasted 24mins. I spent 50% of today's visit counseling Ms. Bouton about weight loss through heart healthy diet, portion control and exercise while she continues to breastfeed her child. Also Advised her about safe way to wean her daughter. She was advised about importance  of adequate sleep, and emotional stability. Use of medication for weight loss while breastfeeding is contraindicated.  Follow up: Return if symptoms worsen or fail to improve.  Wilfred Lacy, NP

## 2018-01-18 LAB — CBC WITH DIFFERENTIAL/PLATELET
Basophils Absolute: 20 {cells}/uL (ref 0–200)
Basophils Relative: 0.3 %
Eosinophils Absolute: 73 {cells}/uL (ref 15–500)
Eosinophils Relative: 1.1 %
HCT: 37.4 % (ref 35.0–45.0)
Hemoglobin: 12.5 g/dL (ref 11.7–15.5)
Lymphs Abs: 2204 {cells}/uL (ref 850–3900)
MCH: 28.7 pg (ref 27.0–33.0)
MCHC: 33.4 g/dL (ref 32.0–36.0)
MCV: 86 fL (ref 80.0–100.0)
MPV: 11.3 fL (ref 7.5–12.5)
Monocytes Relative: 7.7 %
Neutro Abs: 3795 cells/uL (ref 1500–7800)
Neutrophils Relative %: 57.5 %
Platelets: 267 10*3/uL (ref 140–400)
RBC: 4.35 10*6/uL (ref 3.80–5.10)
RDW: 12.7 % (ref 11.0–15.0)
Total Lymphocyte: 33.4 %
WBC mixed population: 508 cells/uL (ref 200–950)
WBC: 6.6 10*3/uL (ref 3.8–10.8)

## 2018-01-18 LAB — COMPREHENSIVE METABOLIC PANEL
AG Ratio: 1.5 (calc) (ref 1.0–2.5)
ALT: 13 U/L (ref 6–29)
AST: 16 U/L (ref 10–30)
Albumin: 4.6 g/dL (ref 3.6–5.1)
Alkaline phosphatase (APISO): 117 U/L — ABNORMAL HIGH (ref 33–115)
BUN: 13 mg/dL (ref 7–25)
CO2: 25 mmol/L (ref 20–32)
Calcium: 9.7 mg/dL (ref 8.6–10.2)
Chloride: 102 mmol/L (ref 98–110)
Creat: 0.65 mg/dL (ref 0.50–1.10)
Globulin: 3 g/dL (calc) (ref 1.9–3.7)
Glucose, Bld: 87 mg/dL (ref 65–99)
Potassium: 4 mmol/L (ref 3.5–5.3)
Sodium: 142 mmol/L (ref 135–146)
Total Bilirubin: 0.2 mg/dL (ref 0.2–1.2)
Total Protein: 7.6 g/dL (ref 6.1–8.1)

## 2018-01-18 LAB — URINALYSIS W MICROSCOPIC + REFLEX CULTURE
Bacteria, UA: NONE SEEN /HPF
Bilirubin Urine: NEGATIVE
Glucose, UA: NEGATIVE
Hgb urine dipstick: NEGATIVE
Hyaline Cast: NONE SEEN /LPF
Ketones, ur: NEGATIVE
Nitrites, Initial: NEGATIVE
Protein, ur: NEGATIVE
Specific Gravity, Urine: 1.029 (ref 1.001–1.03)
pH: 6.5 (ref 5.0–8.0)

## 2018-01-18 LAB — LIPID PANEL
Cholesterol: 172 mg/dL (ref <200)
HDL: 53 mg/dL (ref >50)
LDL Cholesterol (Calc): 92 mg/dL (calc)
Non-HDL Cholesterol (Calc): 119 mg/dL (calc) (ref <130)
Total CHOL/HDL Ratio: 3.2 (calc) (ref <5.0)
Triglycerides: 162 mg/dL — ABNORMAL HIGH (ref <150)

## 2018-01-18 LAB — URINE CULTURE
MICRO NUMBER:: 90113838
SPECIMEN QUALITY:: ADEQUATE

## 2018-01-18 LAB — CULTURE INDICATED

## 2018-01-18 LAB — TSH: TSH: 0.79 mIU/L

## 2018-01-19 ENCOUNTER — Encounter: Payer: Self-pay | Admitting: Nurse Practitioner

## 2018-01-22 DIAGNOSIS — H5213 Myopia, bilateral: Secondary | ICD-10-CM | POA: Diagnosis not present

## 2018-02-02 ENCOUNTER — Emergency Department (HOSPITAL_COMMUNITY)
Admission: EM | Admit: 2018-02-02 | Discharge: 2018-02-02 | Disposition: A | Discharge status: Home / Self Care | Payer: 59 | Attending: Emergency Medicine | Admitting: Emergency Medicine

## 2018-02-02 ENCOUNTER — Other Ambulatory Visit: Payer: Self-pay

## 2018-02-02 ENCOUNTER — Encounter (HOSPITAL_COMMUNITY): Payer: Self-pay

## 2018-02-02 DIAGNOSIS — Y999 Unspecified external cause status: Secondary | ICD-10-CM | POA: Insufficient documentation

## 2018-02-02 DIAGNOSIS — S61011A Laceration without foreign body of right thumb without damage to nail, initial encounter: Secondary | ICD-10-CM | POA: Insufficient documentation

## 2018-02-02 DIAGNOSIS — Y929 Unspecified place or not applicable: Secondary | ICD-10-CM | POA: Diagnosis not present

## 2018-02-02 DIAGNOSIS — W268XXA Contact with other sharp object(s), not elsewhere classified, initial encounter: Secondary | ICD-10-CM | POA: Insufficient documentation

## 2018-02-02 DIAGNOSIS — Y939 Activity, unspecified: Secondary | ICD-10-CM | POA: Insufficient documentation

## 2018-02-02 DIAGNOSIS — Z87891 Personal history of nicotine dependence: Secondary | ICD-10-CM | POA: Insufficient documentation

## 2018-02-02 DIAGNOSIS — S6981XA Other specified injuries of right wrist, hand and finger(s), initial encounter: Secondary | ICD-10-CM | POA: Diagnosis present

## 2018-02-02 MED ORDER — LIDOCAINE HCL 2 % IJ SOLN
20.0000 mL | Freq: Once | INTRAMUSCULAR | Status: AC
  Administered 2018-02-02: 400 mg
  Filled 2018-02-02: qty 20

## 2018-02-02 NOTE — Discharge Instructions (Signed)
Please read attached information. If you experience any new or worsening signs or symptoms please return to the emergency room for evaluation. Please follow-up with your primary care provider or specialist as discussed.  °

## 2018-02-02 NOTE — ED Provider Notes (Signed)
[facility] EMERGENCY DEPARTMENT Provider Note   CSN: [id number] Arrival date & time: 02/02/18  [place]     History   Chief Complaint Chief Complaint  Patient presents with  . Laceration    HPI Tricia Wallace is a 27 y.o. female.  HPI   47 female presents today with a laceration to the right thumb along the palmar aspect.  Patient notes laceration approximately 2 hours prior to arrival.  She notes bleeding controlled with direct pressure.  She reports tetanus is up-to-date.  No medications prior to arrival no other injuries, full active range of motion of the thumb.  Past Medical History:  Diagnosis Date  . Abnormal Pap smear 04/2008   LEEP  . BV (bacterial vaginosis) 2009  . CIN II (cervical intraepithelial neoplasia II) 2009  . First trimester bleeding 06/07/2009  . GBS carrier   . H/O sciatica   . H/O varicella   . H/O: menorrhagia 2011  . History of preterm delivery, currently pregnant 01/06/2013  . Hx: UTI (urinary tract infection)   . Labial tear 07/04/09  . Marijuana use    History of use  . Periurethral tear in female 07/04/09  . S/P LEEP   . Smoker   . Vaginal Pap smear, abnormal    ok since LEEP    Patient Active Problem List   Diagnosis Date Noted  . Newborn product of in vitro fertilization (IVF) pregnancy 05/26/2017  . VBAC, delivered, current hospitalization 05/26/2017  . History of bilateral tubal ligation 05/26/2017  . Preterm delivery of twins by C/S 01/29/2013  . History of cervical LEEP biopsy affecting care of mother, antepartum 09/12/2012    Past Surgical History:  Procedure Laterality Date  . CESAREAN SECTION N/A 01/28/2013   Procedure: CESAREAN SECTION;  Surgeon: Ena Dawley, MD;  Location: [facility];  Service: Obstetrics;  Laterality: N/A;  . DILATION AND CURETTAGE OF UTERUS    . LEEP    . OTHER SURGICAL HISTORY     egg retrieval for IVF  . TONSILLECTOMY    . WISDOM TOOTH EXTRACTION      OB History     Gravida Para Term Preterm AB Living   3 3 2 1   4    SAB TAB Ectopic Multiple Live Births         1 4       Home Medications    Prior to Admission medications   Not on File    Family History Family History  Problem Relation Age of Onset  . Diabetes Brother   . Cancer Maternal Grandfather        Lung  . Cancer Maternal Aunt 46       breast  . Asthma Daughter     Social History Social History   Tobacco Use  . Smoking status: Former Smoker    Packs/day: 1.50    Types: Cigarettes    Last attempt to quit: 01/02/2013    Years since quitting: 5.0  . Smokeless tobacco: Never Used  Substance Use Topics  . Alcohol use: No  . Drug use: No     Allergies   Patient has no known allergies.   Review of Systems Review of Systems  All other systems reviewed and are negative.    Physical Exam Updated Vital Signs BP 103/62 (BP Location: Right Arm)   Pulse 93   Temp 98.7 F (37.1 C) (Oral)   Resp 16   Wt 65.8 kg (145 lb)  SpO2 100%   BMI 24.13 kg/m   Physical Exam  Constitutional: She is oriented to person, place, and time. She appears well-developed and well-nourished.  HENT:  Head: Normocephalic and atraumatic.  Eyes: Conjunctivae are normal. Pupils are equal, round, and reactive to light. Right eye exhibits no discharge. Left eye exhibits no discharge. No scleral icterus.  Neck: Normal range of motion. No JVD present. No tracheal deviation present.  Pulmonary/Chest: Effort normal. No stridor.  Musculoskeletal:  1.5 cm laceration to the right thumb-no deep space involvement full active range of motion of the thumb sensation intact  Neurological: She is alert and oriented to person, place, and time. Coordination normal.  Psychiatric: She has a normal mood and affect. Her behavior is normal. Judgment and thought content normal.  Nursing note and vitals reviewed.    ED Treatments / Results  Labs (all labs ordered are listed, but only abnormal results are  displayed) Labs Reviewed - No data to display  EKG  EKG Interpretation None       Radiology No results found.  Procedures .Marland KitchenLaceration Repair Date/Time: 02/02/2018 9:51 PM Performed by: Okey Regal, PA-C Authorized by: Okey Regal, PA-C   Consent:    Consent obtained:  Verbal   Consent given by:  Patient   Risks discussed:  Infection   Alternatives discussed:  No treatment, delayed treatment and observation Anesthesia (see MAR for exact dosages):    Anesthesia method:  Local infiltration   Local anesthetic:  Lidocaine 2% w/o epi Laceration details:    Location:  Finger   Length (cm):  1.5 Repair type:    Repair type:  Simple Pre-procedure details:    Preparation:  Patient was prepped and draped in usual sterile fashion Exploration:    Wound exploration: wound explored through full range of motion and entire depth of wound probed and visualized     Wound extent: no fascia violation noted, no foreign bodies/material noted, no muscle damage noted, no tendon damage noted, no underlying fracture noted and no vascular damage noted     Contaminated: no   Treatment:    Area cleansed with:  Saline   Amount of cleaning:  Standard   Irrigation solution:  Sterile saline Skin repair:    Repair method:  Sutures   Suture size:  4-0   Suture material:  Fast-absorbing gut   Number of sutures:  4 Approximation:    Approximation:  Close Post-procedure details:    Dressing:  Antibiotic ointment and non-adherent dressing   Patient tolerance of procedure:  Tolerated well, no immediate complications   (including critical care time)  Medications Ordered in ED Medications  lidocaine (XYLOCAINE) 2 % (with pres) injection 400 mg (400 mg Infiltration Given 02/02/18 2116)     Initial Impression / Assessment and Plan / ED Course  I have reviewed the triage vital signs and the nursing notes.  Pertinent labs & imaging results that were available during my care of the patient  were reviewed by me and considered in my medical decision making (see chart for details).      Final Clinical Impressions(s) / ED Diagnoses   Final diagnoses:  Laceration of right thumb without foreign body without damage to nail, initial encounter    Labs:   Imaging:  Consults:  Therapeutics:  Discharge Meds:   Assessment/Plan: 27 year old female with a laceration to her right thumb.  Was repaired without complication.  No signs of infection or deep space involvement.  Symptom medic care instructions given strict  return precautions given.  Patient's tetanus is up-to-date.  She had no further questions or concerns at the time of discharge.      ED Discharge Orders    None       Francee Gentile 02/02/18 2153    Blanchie Dessert, MD 02/03/18 1626

## 2018-02-02 NOTE — ED Triage Notes (Signed)
Pt presents to the ed with complaints of having a laceration on her right thumb that she got while trying to open a can.  CMS intact. Bleeding controlled

## 2018-02-12 ENCOUNTER — Ambulatory Visit (INDEPENDENT_AMBULATORY_CARE_PROVIDER_SITE_OTHER): Payer: 59 | Admitting: Nurse Practitioner

## 2018-02-12 ENCOUNTER — Encounter: Payer: Self-pay | Admitting: Nurse Practitioner

## 2018-02-12 VITALS — BP 112/72 | HR 115 | Temp 98.1°F | Ht 65.0 in | Wt 144.0 lb

## 2018-02-12 DIAGNOSIS — N3000 Acute cystitis without hematuria: Secondary | ICD-10-CM | POA: Diagnosis not present

## 2018-02-12 DIAGNOSIS — R350 Frequency of micturition: Secondary | ICD-10-CM

## 2018-02-12 DIAGNOSIS — N3001 Acute cystitis with hematuria: Secondary | ICD-10-CM | POA: Diagnosis not present

## 2018-02-12 LAB — POCT URINALYSIS DIPSTICK
Blood, UA: NEGATIVE
Glucose, UA: NEGATIVE
Nitrite, UA: NEGATIVE
Spec Grav, UA: 1.015 (ref 1.010–1.025)
Urobilinogen, UA: 1 E.U./dL
pH, UA: 8 (ref 5.0–8.0)

## 2018-02-12 MED ORDER — CEPHALEXIN 500 MG PO CAPS: 500.0000 mg | ORAL_CAPSULE | Freq: Two times a day (BID) | ORAL | 0 refills | Status: DC

## 2018-02-12 NOTE — Progress Notes (Signed)
   Subjective:  Patient ID: Tricia Wallace, female    DOB: 05-Nov-1991  Age: 27 y.o. MRN: [id number]  CC: Urinary Tract Infection (painful and frequent urinate,odor. )   Urinary Tract Infection   This is a new problem. The current episode started in the past 7 days. The problem occurs every urination. The problem has been gradually worsening. The quality of the pain is described as burning. There has been no fever. She is sexually active. There is no history of pyelonephritis. Associated symptoms include frequency. Pertinent negatives include no chills, discharge, flank pain, hematuria, hesitancy, nausea or possible pregnancy.  no hx of renal stone.  No outpatient medications prior to visit.   No facility-administered medications prior to visit.     ROS See HPI  Objective:  BP 112/72   Pulse (!) 115   Temp 98.1 F (36.7 C)   Ht 5\' 5"  (1.651 m)   Wt 144 lb (65.3 kg)   SpO2 99%   BMI 23.96 kg/m   BP Readings from Last 3 Encounters:  02/12/18 112/72  02/02/18 101/64  01/16/18 100/74    Wt Readings from Last 3 Encounters:  02/12/18 144 lb (65.3 kg)  02/02/18 145 lb (65.8 kg)  01/16/18 146 lb (66.2 kg)    Physical Exam  Constitutional: She is oriented to person, place, and time. No distress.  Cardiovascular: Normal rate.  Pulmonary/Chest: Effort normal.  Abdominal: Soft. There is tenderness.  Neurological: She is alert and oriented to person, place, and time.  Psychiatric: She has a normal mood and affect. Her behavior is normal.  Vitals reviewed.   Lab Results  Component Value Date   WBC 6.6 01/16/2018   HGB 12.5 01/16/2018   HCT 37.4 01/16/2018   PLT 267 01/16/2018   GLUCOSE 87 01/16/2018   CHOL 172 01/16/2018   TRIG 162 (H) 01/16/2018   HDL 53 01/16/2018   ALT 13 01/16/2018   AST 16 01/16/2018   NA 142 01/16/2018   K 4.0 01/16/2018   CL 102 01/16/2018   CREATININE 0.65 01/16/2018   BUN 13 01/16/2018   CO2 25 01/16/2018   TSH 0.79 01/16/2018     Assessment & Plan:   Tricia Wallace was seen today for urinary tract infection.  Diagnoses and all orders for this visit:  Acute cystitis without hematuria -     POCT urinalysis dipstick -     Urine Culture -     cephALEXin (KEFLEX) 500 MG capsule; Take 1 capsule (500 mg total) by mouth 2 (two) times daily.  Frequent urination -     POCT urinalysis dipstick -     Urine Culture -     cephALEXin (KEFLEX) 500 MG capsule; Take 1 capsule (500 mg total) by mouth 2 (two) times daily.   I am having Tricia Wallace start on cephALEXin.  Meds ordered this encounter  Medications  . cephALEXin (KEFLEX) 500 MG capsule    Sig: Take 1 capsule (500 mg total) by mouth 2 (two) times daily.    Dispense:  14 capsule    Refill:  0    Order Specific Question:   Supervising Provider    Answer:   Lucille Passy [[id number]]    Follow-up: No Follow-up on file.  Wilfred Lacy, NP

## 2018-02-12 NOTE — Patient Instructions (Addendum)
You will be contacted with urine culture.  Continue to push oral hydration.  Refer to urology if urine culture is negative.

## 2018-02-13 LAB — URINE CULTURE
MICRO NUMBER:: 90230499
SPECIMEN QUALITY:: ADEQUATE

## 2018-02-16 ENCOUNTER — Other Ambulatory Visit: Payer: Self-pay | Admitting: Nurse Practitioner

## 2018-02-16 DIAGNOSIS — R350 Frequency of micturition: Secondary | ICD-10-CM

## 2018-02-16 DIAGNOSIS — R300 Dysuria: Secondary | ICD-10-CM

## 2018-02-16 NOTE — Progress Notes (Signed)
Minimal growth of multiple bacteria which indicates colonization. No oral abx needed. I recommended evaluation by urology due to recurrent symptoms.

## 2018-10-11 ENCOUNTER — Emergency Department (HOSPITAL_COMMUNITY)
Admission: EM | Admit: 2018-10-11 | Discharge: 2018-10-11 | Disposition: A | Discharge status: Home / Self Care | Payer: 59 | Attending: Emergency Medicine | Admitting: Emergency Medicine

## 2018-10-11 ENCOUNTER — Encounter: Payer: Self-pay | Admitting: Emergency Medicine

## 2018-10-11 DIAGNOSIS — R42 Dizziness and giddiness: Secondary | ICD-10-CM | POA: Insufficient documentation

## 2018-10-11 DIAGNOSIS — H5713 Ocular pain, bilateral: Secondary | ICD-10-CM | POA: Diagnosis not present

## 2018-10-11 DIAGNOSIS — H81393 Other peripheral vertigo, bilateral: Secondary | ICD-10-CM | POA: Diagnosis not present

## 2018-10-11 DIAGNOSIS — Z87891 Personal history of nicotine dependence: Secondary | ICD-10-CM | POA: Insufficient documentation

## 2018-10-11 DIAGNOSIS — R531 Weakness: Secondary | ICD-10-CM | POA: Diagnosis not present

## 2018-10-11 LAB — BASIC METABOLIC PANEL
Anion gap: 7 (ref 5–15)
BUN: 6 mg/dL (ref 6–20)
CO2: 24 mmol/L (ref 22–32)
Calcium: 9.2 mg/dL (ref 8.9–10.3)
Chloride: 106 mmol/L (ref 98–111)
Creatinine, Ser: 0.71 mg/dL (ref 0.44–1.00)
GFR calc Af Amer: >60 mL/min (ref >60)
GFR calc non Af Amer: >60 mL/min (ref >60)
Glucose, Bld: 114 mg/dL — ABNORMAL HIGH (ref 70–99)
Potassium: 3.8 mmol/L (ref 3.5–5.1)
Sodium: 137 mmol/L (ref 135–145)

## 2018-10-11 LAB — URINALYSIS, ROUTINE W REFLEX MICROSCOPIC
Bilirubin Urine: NEGATIVE
Glucose, UA: NEGATIVE mg/dL
Ketones, ur: NEGATIVE mg/dL
Nitrite: NEGATIVE
Protein, ur: NEGATIVE mg/dL
Specific Gravity, Urine: 1.01 (ref 1.005–1.030)
pH: 7 (ref 5.0–8.0)

## 2018-10-11 LAB — CBC
HCT: 47.2 % — ABNORMAL HIGH (ref 36.0–46.0)
Hemoglobin: 13.3 g/dL (ref 12.0–15.0)
MCH: 28.7 pg (ref 26.0–34.0)
MCHC: 28.2 g/dL — ABNORMAL LOW (ref 30.0–36.0)
MCV: 101.7 fL — ABNORMAL HIGH (ref 80.0–100.0)
Platelets: 218 10*3/uL (ref 150–400)
RBC: 4.64 MIL/uL (ref 3.87–5.11)
RDW: 12.3 % (ref 11.5–15.5)
WBC: 5.3 10*3/uL (ref 4.0–10.5)
nRBC: 0 % (ref 0.0–0.2)

## 2018-10-11 LAB — CBG MONITORING, ED: Glucose-Capillary: 102 mg/dL — ABNORMAL HIGH (ref 70–99)

## 2018-10-11 LAB — I-STAT BETA HCG BLOOD, ED (MC, WL, AP ONLY): I-stat hCG, quantitative: <5 m[IU]/mL (ref <5)

## 2018-10-11 MED ORDER — ACETAMINOPHEN 325 MG PO TABS
650.0000 mg | ORAL_TABLET | Freq: Once | ORAL | Status: AC
  Administered 2018-10-11: 650 mg via ORAL
  Filled 2018-10-11: qty 2

## 2018-10-11 MED ORDER — MECLIZINE HCL 25 MG PO TABS: 25.0000 mg | ORAL_TABLET | Freq: Two times a day (BID) | ORAL | 0 refills | Status: DC | PRN

## 2018-10-11 MED ORDER — METOCLOPRAMIDE HCL 10 MG PO TABS: 10.0000 mg | ORAL_TABLET | Freq: Once | ORAL | Status: DC

## 2018-10-11 MED ORDER — MECLIZINE HCL 25 MG PO TABS
25.0000 mg | ORAL_TABLET | Freq: Once | ORAL | Status: AC
  Administered 2018-10-11: 25 mg via ORAL
  Filled 2018-10-11: qty 1

## 2018-10-11 NOTE — Discharge Instructions (Addendum)
Take meclizine and home prescription for reglan as needed for vertigo.  Return to the ED for any worsening or other concerns.  Follow up with primary care provider next week. Follow up with ENT as above if symptoms worsen.

## 2018-10-11 NOTE — ED Notes (Signed)
Patient verbalizes understanding of discharge instructions. Opportunity for questioning and answers were provided. Armband removed by staff, pt discharged from ED home via POV.  

## 2018-10-11 NOTE — ED Notes (Signed)
ED Provider at bedside. 

## 2018-10-11 NOTE — ED Triage Notes (Addendum)
Pt reports pain behind her eyes for the past week, states she has been feeling dizzy since this am. States she woke up at 0500 with dizziness, it occurs when she closes her eyes or turns her head from side to side. Pt reports that she "passed out while she was asleep four times today." pt a/ox4 resp e/u, speech clear, no neuro deficits. Denies any falls with syncopal episodes.

## 2018-10-11 NOTE — ED Provider Notes (Signed)
[facility] EMERGENCY DEPARTMENT Provider Note   CSN: [id number] Arrival date & time: 10/11/18  1439     History   Chief Complaint Chief Complaint  Patient presents with  . Headache  . Dizziness    HPI Tricia Wallace is a 27 y.o. female.  HPI  Patient is a 27yo female with  no PMHx who presents with dizziness since waking this morning at 0500.  Patient feels like the room is spinning when she closes her eyes with her vision then going black.  She reports four episodes today with three occurring while laying down in bed and the last while putting her child's car seats in her vehicle.  She states, "it was if my car was tipping over."  Dizziness increases when closing her eyes and lying down.  No alleviating factors.  No tx PTA.  Of notes she reports bilateral eye pain that has been ongoing for the last week.  No syncope, falls, CP, HA, N/V/D, abdominal pain, numbness or weakness.  Otherwise no medical problems.  No hx of sudden cardiac death.  Past Medical History:  Diagnosis Date  . Abnormal Pap smear 04/2008   LEEP  . BV (bacterial vaginosis) 2009  . CIN II (cervical intraepithelial neoplasia II) 2009  . First trimester bleeding 06/07/2009  . GBS carrier   . H/O sciatica   . H/O varicella   . H/O: menorrhagia 2011  . History of preterm delivery, currently pregnant 01/06/2013  . Hx: UTI (urinary tract infection)   . Labial tear 07/04/09  . Marijuana use    History of use  . Periurethral tear in female 07/04/09  . S/P LEEP   . Smoker   . Vaginal Pap smear, abnormal    ok since LEEP    Patient Active Problem List   Diagnosis Date Noted  . Newborn product of in vitro fertilization (IVF) pregnancy 05/26/2017  . VBAC, delivered, current hospitalization 05/26/2017  . History of bilateral tubal ligation 05/26/2017  . Preterm delivery of twins by C/S 01/29/2013  . History of cervical LEEP biopsy affecting care of mother, antepartum 09/12/2012    Past  Surgical History:  Procedure Laterality Date  . CESAREAN SECTION N/A 01/28/2013   Procedure: CESAREAN SECTION;  Surgeon: Ena Dawley, MD;  Location: [facility];  Service: Obstetrics;  Laterality: N/A;  . DILATION AND CURETTAGE OF UTERUS    . LEEP    . OTHER SURGICAL HISTORY     egg retrieval for IVF  . TONSILLECTOMY    . WISDOM TOOTH EXTRACTION       OB History    Gravida  3   Para  3   Term  2   Preterm  1   AB      Living  4     SAB      TAB      Ectopic      Multiple  1   Live Births  4            Home Medications    Prior to Admission medications   Medication Sig Start Date End Date Taking? Authorizing Provider  cephALEXin (KEFLEX) 500 MG capsule Take 1 capsule (500 mg total) by mouth 2 (two) times daily. 02/12/18   [name], Charlene Brooke, NP  meclizine (ANTIVERT) 25 MG tablet Take 1 tablet (25 mg total) by mouth 2 (two) times daily as needed for dizziness. 10/11/18   Fabian November, MD    Family History Family  History  Problem Relation Age of Onset  . Diabetes Brother   . Cancer Maternal Grandfather        Lung  . Cancer Maternal Aunt 55       breast  . Asthma Daughter     Social History Social History   Tobacco Use  . Smoking status: Former Smoker    Packs/day: 1.50    Types: Cigarettes    Last attempt to quit: 01/02/2013    Years since quitting: 5.7  . Smokeless tobacco: Never Used  Substance Use Topics  . Alcohol use: No  . Drug use: No     Allergies   Patient has no known allergies.   Review of Systems Review of Systems  Constitutional: Negative for chills and fever.  HENT: Negative for congestion, ear pain and sore throat.   Eyes: Positive for pain. Negative for visual disturbance.  Respiratory: Negative for cough and shortness of breath.   Cardiovascular: Negative for chest pain and palpitations.  Gastrointestinal: Negative for abdominal pain, diarrhea, nausea and vomiting.  Genitourinary: Negative for dysuria and hematuria.    Musculoskeletal: Negative for arthralgias and back pain.  Skin: Negative for color change and rash.  Neurological: Positive for dizziness. Negative for seizures, syncope and headaches.  All other systems reviewed and are negative.    Physical Exam Updated Vital Signs BP 111/71   Pulse 78   Temp 98.3 F (36.8 C) (Oral)   Resp 17   LMP 09/29/2018 (Exact Date)   SpO2 100%   Physical Exam  Constitutional: She is oriented to person, place, and time. She appears well-developed and well-nourished. She does not appear ill. No distress.  HENT:  Head: Normocephalic and atraumatic.  Mouth/Throat: Oropharynx is clear and moist.  Eyes: Pupils are equal, round, and reactive to light. Conjunctivae and EOM are normal.  Neck: Normal range of motion. Neck supple.  No nystagmus.  Cardiovascular: Normal rate, regular rhythm and intact distal pulses.  No murmur heard. Pulmonary/Chest: Effort normal and breath sounds normal. No respiratory distress.  Abdominal: Soft. She exhibits no distension. There is no tenderness. There is no guarding.  Musculoskeletal: Normal range of motion.  Neurological: She is alert and oriented to person, place, and time. She has normal strength. No cranial nerve deficit or sensory deficit. Coordination normal. GCS eye subscore is 4. GCS verbal subscore is 5. GCS motor subscore is 6.  Positive Marye Round.    Skin: Skin is warm and dry. Capillary refill takes less than 2 seconds. No rash noted.  Psychiatric: She has a normal mood and affect.  Nursing note and vitals reviewed.    ED Treatments / Results  Labs (all labs ordered are listed, but only abnormal results are displayed) Labs Reviewed  BASIC METABOLIC PANEL - Abnormal; Notable for the following components:      Result Value   Glucose, Bld 114 (*)    All other components within normal limits  CBC - Abnormal; Notable for the following components:   HCT 47.2 (*)    MCV 101.7 (*)    MCHC 28.2 (*)    All  other components within normal limits  URINALYSIS, ROUTINE W REFLEX MICROSCOPIC - Abnormal; Notable for the following components:   APPearance CLOUDY (*)    Hgb urine dipstick SMALL (*)    Leukocytes, UA LARGE (*)    Bacteria, UA RARE (*)    All other components within normal limits  CBG MONITORING, ED - Abnormal; Notable for the following components:  Glucose-Capillary 102 (*)    All other components within normal limits  I-STAT BETA HCG BLOOD, ED ([facility], [facility], [facility] ONLY)    EKG EKG Interpretation  Date/Time:  Sunday October 11 2018 15:07:05 EDT Ventricular Rate:  84 PR Interval:  162 QRS Duration: 84 QT Interval:  350 QTC Calculation: 413 R Axis:   88 Text Interpretation:  Normal sinus rhythm with sinus arrhythmia Normal ECG Confirmed by Pickering, Nathan ([id number]) on 10/11/2018 3:58:42 PM   Radiology No results found.  Procedures Procedures (including critical care time)  Medications Ordered in ED Medications  meclizine (ANTIVERT) tablet 25 mg (25 mg Oral Given 10/11/18 1610)  acetaminophen (TYLENOL) tablet 650 mg (650 mg Oral Given 10/11/18 1610)     Initial Impression / Assessment and Plan / ED Course  I have reviewed the triage vital signs and the nursing notes.  Pertinent labs & imaging results that were available during my care of the patient were reviewed by me and considered in my medical decision making (see chart for details).      Patient is a 27yo female with no PMHx who presents with dizziness since waking this morning at 0500 that worsens when closing her eyes and lying flat.  No fevers, HA, CP, falls, or syncope.  On arrival she is HDS and well appearing.  Exam as above with positive Dix Hallpike otherwise no focal neurologic deficits.  Etiology likely 2/2 peripheral vertigo vs BPPV.  Will give meclizine and reassess.  No concern for central causes as this would be inconsistent with history and physical, low risk, and other diagnosis much more likely.  EKG  shows NSR with a rate of 84 and no evidence of acute ischemic changes or abnormal intervals.  Specifically there is no evidence of HOCM, prolonged QTC, WPW, Brugada, or ARVD.  Baseline labs obtained and unremarkable.  No anemia. Not pregnant.  Patient with continued dizziness after meclizine.  No improvement with Epley maneuver.  Shared decision making was had patient who opts to go home with meclizine Rx.  She will take reglan that she is already prescribed PRN.   She feels safe to drive and ambulated without difficulty.  Symptoms only occur when lying flat too quickly.  ENT follow-up and strict return precautions given.  Old records reviewed.  Imaging and labs reviewed and interpreted by myself and attending and used in the MDM.  Addressed patient question and concerns.  Reviewed discharged instructions with strict precautions given.  Advised patient to schedule follow-up with primary care provider.  Patient verbalized understanding and agrees with plan.  Patient stable at discharge.  The plan for this patient was discussed with Dr. Pickering who voiced agreement and who oversaw evaluation and treatment of this patient.  Final Clinical Impressions(s) / ED Diagnoses   Final diagnoses:  Peripheral vertigo, bilateral    ED Discharge Orders         Ordered    meclizine (ANTIVERT) 25 MG tablet  2 times daily PRN     10 /20/19 1727           Fabian November, MD 10/11/18 1733    Davonna Belling, MD 10/11/18 [phone]

## 2019-01-18 ENCOUNTER — Ambulatory Visit (INDEPENDENT_AMBULATORY_CARE_PROVIDER_SITE_OTHER): Payer: 59 | Admitting: Family Medicine

## 2019-01-18 ENCOUNTER — Encounter: Payer: Self-pay | Admitting: Family Medicine

## 2019-01-18 VITALS — BP 110/70 | HR 100 | Temp 99.5°F | Ht 65.0 in | Wt 130.4 lb

## 2019-01-18 DIAGNOSIS — Z20828 Contact with and (suspected) exposure to other viral communicable diseases: Secondary | ICD-10-CM

## 2019-01-18 DIAGNOSIS — B349 Viral infection, unspecified: Secondary | ICD-10-CM | POA: Diagnosis not present

## 2019-01-18 LAB — POC INFLUENZA A&B (BINAX/QUICKVUE)
Influenza A, POC: NEGATIVE
Influenza B, POC: NEGATIVE

## 2019-01-18 MED ORDER — BENZONATATE 200 MG PO CAPS: 200.0000 mg | ORAL_CAPSULE | Freq: Two times a day (BID) | ORAL | 0 refills | Status: DC | PRN

## 2019-01-18 MED ORDER — OSELTAMIVIR PHOSPHATE 75 MG PO CAPS: 75.0000 mg | ORAL_CAPSULE | Freq: Two times a day (BID) | ORAL | 0 refills | Status: DC

## 2019-01-18 MED ORDER — OSELTAMIVIR PHOSPHATE 75 MG PO CAPS: 75.0000 mg | ORAL_CAPSULE | Freq: Two times a day (BID) | ORAL | 1 refills | Status: AC

## 2019-01-18 NOTE — Progress Notes (Signed)
Established Patient Office Visit  Subjective:  Patient ID: Tricia Wallace, female    DOB: [birth year]  Age: 28 y.o. MRN: [id number]  CC:  Chief Complaint  Patient presents with  . flu like symptoms    HPI [employer] presents for evaluation and treatment of a 1 day history of fatigue malaise, postnasal drip sore throat cough myalgias and arthralgias.  She denies fever, nausea vomiting, reactive airway disease or history of asthma.  She is tried no medicines for this.  All 4 of her children have been recently diagnosed with the flu.  Her husband is at home caring for them.  Past Medical History:  Diagnosis Date  . Abnormal Pap smear 04/2008   LEEP  . BV (bacterial vaginosis) 2009  . CIN II (cervical intraepithelial neoplasia II) 2009  . First trimester bleeding 06/07/2009  . GBS carrier   . H/O sciatica   . H/O varicella   . H/O: menorrhagia 2011  . History of preterm delivery, currently pregnant 01/06/2013  . Hx: UTI (urinary tract infection)   . Labial tear 07/04/09  . Marijuana use    History of use  . Periurethral tear in female 07/04/09  . S/P LEEP   . Smoker   . Vaginal Pap smear, abnormal    ok since LEEP    Past Surgical History:  Procedure Laterality Date  . CESAREAN SECTION N/A 01/28/2013   Procedure: CESAREAN SECTION;  Surgeon: Ena Dawley, MD;  Location: [facility];  Service: Obstetrics;  Laterality: N/A;  . DILATION AND CURETTAGE OF UTERUS    . LEEP    . OTHER SURGICAL HISTORY     egg retrieval for IVF  . TONSILLECTOMY    . WISDOM TOOTH EXTRACTION      Family History  Problem Relation Age of Onset  . Diabetes Brother   . Cancer Maternal Grandfather        Lung  . Cancer Maternal Aunt 53       breast  . Asthma Daughter     Social History   Socioeconomic History  . Marital status: Married    Spouse name: Not on file  . Number of children: Not on file  . Years of education: Not on file  . Highest education level: Not on file  Occupational  History  . Not on file  Social Needs  . Financial resource strain: Not on file  . Food insecurity:    Worry: Not on file    Inability: Not on file  . Transportation needs:    Medical: Not on file    Non-medical: Not on file  Tobacco Use  . Smoking status: Former Smoker    Packs/day: 1.50    Types: Cigarettes    Last attempt to quit: 01/02/2013    Years since quitting: 6.0  . Smokeless tobacco: Never Used  Substance and Sexual Activity  . Alcohol use: No  . Drug use: No  . Sexual activity: Yes    Birth control/protection: None  Lifestyle  . Physical activity:    Days per week: Not on file    Minutes per session: Not on file  . Stress: Not on file  Relationships  . Social connections:    Talks on phone: Not on file    Gets together: Not on file    Attends religious service: Not on file    Active member of club or organization: Not on file    Attends meetings of clubs or organizations:  Not on file    Relationship status: Not on file  . Intimate partner violence:    Fear of current or ex partner: Not on file    Emotionally abused: Not on file    Physically abused: Not on file    Forced sexual activity: Not on file  Other Topics Concern  . Not on file  Social History Narrative  . Not on file    Outpatient Medications Prior to Visit  Medication Sig Dispense Refill  . cephALEXin (KEFLEX) 500 MG capsule Take 1 capsule (500 mg total) by mouth 2 (two) times daily. 14 capsule 0  . meclizine (ANTIVERT) 25 MG tablet Take 1 tablet (25 mg total) by mouth 2 (two) times daily as needed for dizziness. 30 tablet 0   No facility-administered medications prior to visit.     No Known Allergies  ROS Review of Systems  Constitutional: Positive for fatigue. Negative for chills, diaphoresis, fever and unexpected weight change.  HENT: Positive for congestion, postnasal drip and sore throat. Negative for dental problem, rhinorrhea, sinus pressure, sinus pain and trouble swallowing.     Eyes: Negative for photophobia and visual disturbance.  Respiratory: Positive for cough. Negative for shortness of breath and wheezing.   Cardiovascular: Negative.   Gastrointestinal: Negative for nausea and vomiting.  Musculoskeletal: Positive for arthralgias.  Allergic/Immunologic: Negative for immunocompromised state.  Neurological: Positive for headaches.  Hematological: Negative for adenopathy.  Psychiatric/Behavioral: Negative.       Objective:    Physical Exam  Constitutional: She is oriented to person, place, and time. She appears well-developed and well-nourished. No distress.  HENT:  Head: Normocephalic and atraumatic.  Right Ear: Tympanic membrane, external ear and ear canal normal.  Left Ear: Tympanic membrane, external ear and ear canal normal.  Mouth/Throat: Uvula is midline and mucous membranes are normal. No oropharyngeal exudate, posterior oropharyngeal edema, posterior oropharyngeal erythema or tonsillar abscesses.    Eyes: Pupils are equal, round, and reactive to light. Conjunctivae are normal. Right eye exhibits no discharge. Left eye exhibits no discharge. No scleral icterus.  Neck: Neck supple. No JVD present. No tracheal deviation present. No thyromegaly present.  Cardiovascular: Normal rate, regular rhythm and normal heart sounds.  Pulmonary/Chest: Effort normal and breath sounds normal. No stridor. No respiratory distress. She has no wheezes. She has no rales.  Lymphadenopathy:    She has no cervical adenopathy.  Neurological: She is alert and oriented to person, place, and time.  Skin: Skin is warm and dry.  Psychiatric: She has a normal mood and affect. Her behavior is normal.    BP 110/70   Pulse 100   Temp 99.5 F (37.5 C) (Oral)   Ht 5\' 5"  (1.651 m)   Wt 130 lb 6 oz (59.1 kg)   SpO2 98%   BMI 21.70 kg/m  Wt Readings from Last 3 Encounters:  01/18/19 130 lb 6 oz (59.1 kg)  02/12/18 144 lb (65.3 kg)  02/02/18 145 lb (65.8 kg)   BP Readings  from Last 3 Encounters:  01/18/19 110/70  10/11/18 94/66  02/12/18 112/72   Guideline developer:  UpToDate (see UpToDate for funding source) Date Released: June 2014  Health Maintenance Due  Topic Date Due  . PAP-Cervical Cytology Screening  11/19/2015  . PAP SMEAR-Modifier  11/19/2015  . INFLUENZA VACCINE  07/23/2018    There are no preventive care reminders to display for this patient.  Lab Results  Component Value Date   TSH 0.79 01/16/2018   Lab  Results  Component Value Date   WBC 5.3 10/11/2018   HGB 13.3 10/11/2018   HCT 47.2 (H) 10/11/2018   MCV 101.7 (H) 10/11/2018   PLT 218 10/11/2018   Lab Results  Component Value Date   NA 137 10/11/2018   K 3.8 10/11/2018   CO2 24 10/11/2018   GLUCOSE 114 (H) 10/11/2018   BUN 6 10/11/2018   CREATININE 0.71 10/11/2018   BILITOT 0.2 01/16/2018   ALKPHOS 101 02/20/2013   AST 16 01/16/2018   ALT 13 01/16/2018   PROT 7.6 01/16/2018   ALBUMIN 3.6 02/20/2013   CALCIUM 9.2 10/11/2018   ANIONGAP 7 10/11/2018   Lab Results  Component Value Date   CHOL 172 01/16/2018   Lab Results  Component Value Date   HDL 53 01/16/2018   Lab Results  Component Value Date   LDLCALC 92 01/16/2018   Lab Results  Component Value Date   TRIG 162 (H) 01/16/2018   Lab Results  Component Value Date   CHOLHDL 3.2 01/16/2018   No results found for: HGBA1C    Assessment & Plan:   Problem List Items Addressed This Visit    None    Visit Diagnoses    Exposure to the flu    -  Primary   Relevant Medications   benzonatate (TESSALON) 200 MG capsule   oseltamivir (TAMIFLU) 75 MG capsule   Other Relevant Orders   POC Influenza A&B(BINAX/QUICKVUE) (Completed)   Viral syndrome       Relevant Medications   benzonatate (TESSALON) 200 MG capsule   oseltamivir (TAMIFLU) 75 MG capsule      Meds ordered this encounter  Medications  . DISCONTD: oseltamivir (TAMIFLU) 75 MG capsule    Sig: Take 1 capsule (75 mg total) by mouth 2 (two)  times daily for 5 days.    Dispense:  10 capsule    Refill:  0  . DISCONTD: benzonatate (TESSALON) 200 MG capsule    Sig: Take 1 capsule (200 mg total) by mouth 2 (two) times daily as needed for cough.    Dispense:  20 capsule    Refill:  0  . DISCONTD: oseltamivir (TAMIFLU) 75 MG capsule    Sig: Take 1 capsule (75 mg total) by mouth 2 (two) times daily for 5 days.    Dispense:  10 capsule    Refill:  0  . benzonatate (TESSALON) 200 MG capsule    Sig: Take 1 capsule (200 mg total) by mouth 2 (two) times daily as needed for cough.    Dispense:  20 capsule    Refill:  0  . oseltamivir (TAMIFLU) 75 MG capsule    Sig: Take 1 capsule (75 mg total) by mouth 2 (two) times daily for 5 days.    Dispense:  10 capsule    Refill:  1    Follow-up: Return in about 5 days (around 01/23/2019), or if symptoms worsen or fail to improve.

## 2019-01-18 NOTE — Patient Instructions (Signed)
Viral Respiratory Infection A respiratory infection is an illness that affects part of the respiratory system, such as the lungs, nose, or throat. A respiratory infection that is caused by a virus is called a viral respiratory infection. Common types of viral respiratory infections include:  A cold.  The flu (influenza).  A respiratory syncytial virus (RSV) infection. What are the causes? This condition is caused by a virus. What are the signs or symptoms? Symptoms of this condition include:  A stuffy or runny nose.  Yellow or green nasal discharge.  A cough.  Sneezing.  Fatigue.  Achy muscles.  A sore throat.  Sweating or chills.  A fever.  A headache. How is this diagnosed? This condition may be diagnosed based on:  Your symptoms.  A physical exam.  Testing of nasal swabs. How is this treated? This condition may be treated with medicines, such as:  Antiviral medicine. This may shorten the length of time a person has symptoms.  Expectorants. These make it easier to cough up mucus.  Decongestant nasal sprays.  Acetaminophen or NSAIDs to relieve fever and pain. Antibiotic medicines are not prescribed for viral infections. This is because antibiotics are designed to kill bacteria. They are not effective against viruses. Follow these instructions at home:  Managing pain and congestion  Take over-the-counter and prescription medicines only as told by your health care provider.  If you have a sore throat, gargle with a salt-water mixture 3-4 times a day or as needed. To make a salt-water mixture, completely dissolve -1 tsp of salt in 1 cup of warm water.  Use nose drops made from salt water to ease congestion and soften raw skin around your nose.  Drink enough fluid to keep your urine pale yellow. This helps prevent dehydration and helps loosen up mucus. General instructions  Rest as much as possible.  Do not drink alcohol.  Do not use any products  that contain nicotine or tobacco, such as cigarettes and e-cigarettes. If you need help quitting, ask your health care provider.  Keep all follow-up visits as told by your health care provider. This is important. How is this prevented?   Get an annual flu shot. You may get the flu shot in late summer, fall, or winter. Ask your health care provider when you should get your flu shot.  Avoid exposing others to your respiratory infection. ? Stay home from work or school as told by your health care provider. ? Wash your hands with soap and water often, especially after you cough or sneeze. If soap and water are not available, use alcohol-based hand sanitizer.  Avoid contact with people who are sick during cold and flu season. This is generally fall and winter. Contact a health care provider if:  Your symptoms last for 10 days or longer.  Your symptoms get worse over time.  You have a fever.  You have severe sinus pain in your face or forehead.  The glands in your jaw or neck become very swollen. Get help right away if you:  Feel pain or pressure in your chest.  Have shortness of breath.  Faint or feel like you will faint.  Have severe and persistent vomiting.  Feel confused or disoriented. Summary  A respiratory infection is an illness that affects part of the respiratory system, such as the lungs, nose, or throat. A respiratory infection that is caused by a virus is called a viral respiratory infection.  Common types of viral respiratory infections are a   cold, influenza, and respiratory syncytial virus (RSV) infection.  Symptoms of this condition include a stuffy or runny nose, cough, sneezing, fatigue, achy muscles, sore throat, and fevers or chills.  Antibiotic medicines are not prescribed for viral infections. This is because antibiotics are designed to kill bacteria. They are not effective against viruses. This information is not intended to replace advice given to you by  your health care provider. Make sure you discuss any questions you have with your health care provider. Document Released: 09/18/2005 Document Revised: 01/19/2018 Document Reviewed: 01/19/2018 Elsevier Interactive Patient Education  2019 Elsevier Inc. Oseltamivir capsules What is this medicine? OSELTAMIVIR (os el TAM i vir) is an antiviral medicine. It is used to prevent and to treat some kinds of influenza or the flu. It will not work for colds or other viral infections. This medicine may be used for other purposes; ask your health care provider or pharmacist if you have questions. COMMON BRAND NAME(S): Tamiflu What should I tell my health care provider before I take this medicine? They need to know if you have any of the following conditions: -heart disease -immune system problems -kidney disease -liver disease -lung disease -an unusual or allergic reaction to oseltamivir, other medicines, foods, dyes, or preservatives -pregnant or trying to get pregnant -breast-feeding How should I use this medicine? Take this medicine by mouth with a glass of water. Follow the directions on the prescription label. Start this medicine at the first sign of flu symptoms. You can take it with or without food. If it upsets your stomach, take it with food. Take your medicine at regular intervals. Do not take your medicine more often than directed. Take all of your medicine as directed even if you think you are better. Do not skip doses or stop your medicine early. Talk to your pediatrician regarding the use of this medicine in children. While this drug may be prescribed for children as young as 14 days for selected conditions, precautions do apply. Overdosage: If you think you have taken too much of this medicine contact a poison control center or emergency room at once. NOTE: This medicine is only for you. Do not share this medicine with others. What if I miss a dose? If you miss a dose, take it as soon as  you remember. If it is almost time for your next dose (within 2 hours), take only that dose. Do not take double or extra doses. What may interact with this medicine? - intranasal influenza vaccine This list may not describe all possible interactions. Give your health care provider a list of all the medicines, herbs, non-prescription drugs, or dietary supplements you use. Also tell them if you smoke, drink alcohol, or use illegal drugs. Some items may interact with your medicine. What should I watch for while using this medicine? Visit your doctor or health care professional for regular check ups. Tell your doctor if your symptoms do not start to get better or if they get worse. If you have the flu, you may be at an increased risk of developing seizures, confusion, or abnormal behavior. This occurs early in the illness, and more frequently in children and teens. These events are not common, but may result in accidental injury to the patient. Families and caregivers of patients should watch for signs of unusual behavior and contact a doctor or health care professional right away if the patient shows signs of unusual behavior. This medicine is not a substitute for the flu shot. Talk to your  doctor each year about an annual flu shot. What side effects may I notice from receiving this medicine? Side effects that you should report to your doctor or health care professional as soon as possible: -allergic reactions like skin rash, itching or hives, swelling of the face, lips, or tongue -anxiety, confusion, unusual behavior -breathing problems -hallucination, loss of contact with reality -redness, blistering, peeling or loosening of the skin, including inside the mouth -seizures Side effects that usually do not require medical attention (report to your doctor or health care professional if they continue or are bothersome): -diarrhea -headache -nausea, vomiting -pain This list may not describe all  possible side effects. Call your doctor for medical advice about side effects. You may report side effects to FDA at 1-800-FDA-1088. Where should I keep my medicine? Keep out of the reach of children. Store at room temperature between 15 and 30 degrees C (59 and 86 degrees F). Throw away any unused medicine after the expiration date. NOTE: This sheet is a summary. It may not cover all possible information. If you have questions about this medicine, talk to your doctor, pharmacist, or health care provider.  2019 Elsevier/Gold Standard (2017-05-21 16:45:14)

## 2019-04-09 ENCOUNTER — Telehealth: Payer: Self-pay | Admitting: Nurse Practitioner

## 2019-04-09 NOTE — Telephone Encounter (Signed)
Called and left vm for patient. Calling to schedule virtual visit with [name].

## 2019-04-12 ENCOUNTER — Telehealth: Payer: Self-pay | Admitting: Nurse Practitioner

## 2019-04-12 NOTE — Telephone Encounter (Signed)
I called and left message on patient voicemail per Wilfred Lacy to call office and schedule appointment for CPE.

## 2019-09-06 DIAGNOSIS — L84 Corns and callosities: Secondary | ICD-10-CM | POA: Diagnosis not present

## 2019-09-06 DIAGNOSIS — Z872 Personal history of diseases of the skin and subcutaneous tissue: Secondary | ICD-10-CM | POA: Diagnosis not present

## 2019-09-06 DIAGNOSIS — L814 Other melanin hyperpigmentation: Secondary | ICD-10-CM | POA: Diagnosis not present

## 2019-09-06 DIAGNOSIS — L821 Other seborrheic keratosis: Secondary | ICD-10-CM | POA: Diagnosis not present

## 2019-09-06 DIAGNOSIS — D225 Melanocytic nevi of trunk: Secondary | ICD-10-CM | POA: Diagnosis not present

## 2019-11-12 DIAGNOSIS — H5213 Myopia, bilateral: Secondary | ICD-10-CM | POA: Diagnosis not present

## 2020-03-13 ENCOUNTER — Telehealth (INDEPENDENT_AMBULATORY_CARE_PROVIDER_SITE_OTHER): Payer: 59 | Admitting: Nurse Practitioner

## 2020-03-13 ENCOUNTER — Encounter: Payer: Self-pay | Admitting: Nurse Practitioner

## 2020-03-13 ENCOUNTER — Ambulatory Visit: Payer: 59 | Attending: Internal Medicine

## 2020-03-13 VITALS — Temp 98.7°F | Ht 65.0 in | Wt 128.0 lb

## 2020-03-13 DIAGNOSIS — R6889 Other general symptoms and signs: Secondary | ICD-10-CM | POA: Diagnosis not present

## 2020-03-13 DIAGNOSIS — Z20828 Contact with and (suspected) exposure to other viral communicable diseases: Secondary | ICD-10-CM | POA: Diagnosis not present

## 2020-03-13 MED ORDER — OSELTAMIVIR PHOSPHATE 75 MG PO CAPS: 75.0000 mg | ORAL_CAPSULE | Freq: Two times a day (BID) | ORAL | 0 refills | Status: DC

## 2020-03-13 NOTE — Patient Instructions (Signed)
Health Maintenance Due  Topic Date Due  . PAP-Cervical Cytology Screening  11/19/2015  . PAP SMEAR-Modifier  11/19/2015  . INFLUENZA VACCINE  07/24/2019    Depression screen PHQ 2/9 01/06/2018  Decreased Interest 0  PHQ - 2 Score 0

## 2020-03-13 NOTE — Progress Notes (Signed)
Virtual Visit via Video Note  I connected with@ on 03/13/20 at 12:30 PM EDT by a video enabled telemedicine application and verified that I am speaking with the correct person using two identifiers.  Location: Patient:Home Provider: Office Participants: patient and provider  I discussed the limitations of evaluation and management by telemedicine and the availability of in person appointments. I also discussed with the patient that there may be a patient responsible charge related to this service. The patient expressed understanding and agreed to proceed.  CC: Pt c/o everyone in her houshold have tested positive for influenza B as of yesterday.  Pt woke up with a sore throat and running nose, along with body aches. Children were negative for COVID.  History of Present Illness: Sore Throat  This is a new problem. The current episode started today. The problem has been unchanged. The maximum temperature recorded prior to her arrival was 100.4 - 100.9 F. Associated symptoms include congestion, ear pain, headaches and a hoarse voice. Pertinent negatives include no abdominal pain, coughing, diarrhea, drooling, ear discharge, plugged ear sensation, neck pain, shortness of breath, stridor, swollen glands, trouble swallowing or vomiting. She has had no exposure to strep or mono. She has tried nothing for the symptoms.  COVID test scheduled for 03/13/2020 at 2pm  Observations/Objective: Physical Exam  Constitutional: She is oriented to person, place, and time. No distress.  Pulmonary/Chest: Effort normal.  Neurological: She is alert and oriented to person, place, and time.  Vitals reviewed.  Assessment and Plan: Missie was seen today for sore throat.  Diagnoses and all orders for this visit:  Flu-like symptoms -     oseltamivir (TAMIFLU) 75 MG capsule; Take 1 capsule (75 mg total) by mouth 2 (two) times daily.  Exposure to influenza -     oseltamivir (TAMIFLU) 75 MG capsule; Take 1 capsule  (75 mg total) by mouth 2 (two) times daily.    Follow Up Instructions: See avs   I discussed the assessment and treatment plan with the patient. The patient was provided an opportunity to ask questions and all were answered. The patient agreed with the plan and demonstrated an understanding of the instructions.   The patient was advised to call back or seek an in-person evaluation if the symptoms worsen or if the condition fails to improve as anticipated.  Wilfred Lacy, NP

## 2020-11-14 DIAGNOSIS — Z20822 Contact with and (suspected) exposure to covid-19: Secondary | ICD-10-CM | POA: Diagnosis not present

## 2021-04-24 ENCOUNTER — Ambulatory Visit: Payer: 59 | Admitting: Nurse Practitioner

## 2021-04-24 DIAGNOSIS — Z124 Encounter for screening for malignant neoplasm of cervix: Secondary | ICD-10-CM | POA: Diagnosis not present

## 2021-04-24 DIAGNOSIS — Z01419 Encounter for gynecological examination (general) (routine) without abnormal findings: Secondary | ICD-10-CM | POA: Diagnosis not present

## 2021-04-24 DIAGNOSIS — Z6826 Body mass index (BMI) 26.0-26.9, adult: Secondary | ICD-10-CM | POA: Diagnosis not present

## 2021-04-24 DIAGNOSIS — Z3040 Encounter for surveillance of contraceptives, unspecified: Secondary | ICD-10-CM | POA: Diagnosis not present

## 2022-07-24 ENCOUNTER — Emergency Department (HOSPITAL_BASED_OUTPATIENT_CLINIC_OR_DEPARTMENT_OTHER): Payer: 59 | Admitting: Radiology

## 2022-07-24 ENCOUNTER — Encounter (HOSPITAL_BASED_OUTPATIENT_CLINIC_OR_DEPARTMENT_OTHER): Payer: Self-pay | Admitting: Emergency Medicine

## 2022-07-24 ENCOUNTER — Emergency Department (HOSPITAL_BASED_OUTPATIENT_CLINIC_OR_DEPARTMENT_OTHER)
Admission: EM | Admit: 2022-07-24 | Discharge: 2022-07-24 | Disposition: A | Discharge status: Home / Self Care | Payer: 59 | Attending: Emergency Medicine | Admitting: Emergency Medicine

## 2022-07-24 ENCOUNTER — Other Ambulatory Visit: Payer: Self-pay

## 2022-07-24 DIAGNOSIS — R0789 Other chest pain: Secondary | ICD-10-CM | POA: Diagnosis not present

## 2022-07-24 DIAGNOSIS — R079 Chest pain, unspecified: Secondary | ICD-10-CM | POA: Insufficient documentation

## 2022-07-24 DIAGNOSIS — R1310 Dysphagia, unspecified: Secondary | ICD-10-CM | POA: Insufficient documentation

## 2022-07-24 DIAGNOSIS — R0602 Shortness of breath: Secondary | ICD-10-CM | POA: Diagnosis not present

## 2022-07-24 LAB — CBC
HCT: 38.7 % (ref 36.0–46.0)
Hemoglobin: 13.2 g/dL (ref 12.0–15.0)
MCH: 30.1 pg (ref 26.0–34.0)
MCHC: 34.1 g/dL (ref 30.0–36.0)
MCV: 88.2 fL (ref 80.0–100.0)
Platelets: 299 10*3/uL (ref 150–400)
RBC: 4.39 MIL/uL (ref 3.87–5.11)
RDW: 12 % (ref 11.5–15.5)
WBC: 7.6 10*3/uL (ref 4.0–10.5)
nRBC: 0 % (ref 0.0–0.2)

## 2022-07-24 LAB — TROPONIN I (HIGH SENSITIVITY)
Troponin I (High Sensitivity): <2 ng/L (ref <18)
Troponin I (High Sensitivity): <2 ng/L (ref <18)

## 2022-07-24 LAB — BASIC METABOLIC PANEL
Anion gap: 10 (ref 5–15)
BUN: 7 mg/dL (ref 6–20)
CO2: 26 mmol/L (ref 22–32)
Calcium: 9.9 mg/dL (ref 8.9–10.3)
Chloride: 102 mmol/L (ref 98–111)
Creatinine, Ser: 0.82 mg/dL (ref 0.44–1.00)
GFR, Estimated: >60 mL/min (ref >60)
Glucose, Bld: 93 mg/dL (ref 70–99)
Potassium: 4.9 mmol/L (ref 3.5–5.1)
Sodium: 138 mmol/L (ref 135–145)

## 2022-07-24 LAB — PREGNANCY, URINE: Preg Test, Ur: NEGATIVE

## 2022-07-24 MED ORDER — DICYCLOMINE HCL 20 MG PO TABS: 20.0000 mg | ORAL_TABLET | Freq: Two times a day (BID) | ORAL | 0 refills | Status: DC

## 2022-07-24 NOTE — ED Provider Notes (Signed)
[facility] EMERGENCY DEPT Provider Note   CSN: [id number] Arrival date & time: 07/24/22  1803     History  Chief Complaint  Patient presents with   Chest Pain    Tricia Wallace is a 31 y.o. female who presents the emergency department complaining of centralized chest pain for the past 2 weeks.  Patient describes it as a squeezing and pressure sensation in her mid sternum.  She states that when it flares she feels as though she cannot catch her breath.  Notices that she has difficulty swallowing thick textured foods such as bread.  She has tried reflux medications without relief.  Does not take OCPs, denies any recent long travel.  Used to smoke, quit in 2014.  No history of blood clots.   Chest Pain Associated symptoms: shortness of breath   Associated symptoms: no abdominal pain, no cough, no fever, no nausea, no numbness, no palpitations, no vomiting and no weakness        Home Medications Prior to Admission medications   Medication Sig Start Date End Date Taking? Authorizing Provider  dicyclomine (BENTYL) 20 MG tablet Take 1 tablet (20 mg total) by mouth 2 (two) times daily. 07/24/22  Yes Tayler Lassen T, PA-C  oseltamivir (TAMIFLU) 75 MG capsule Take 1 capsule (75 mg total) by mouth 2 (two) times daily. 03/13/20   [name], Charlene Brooke, NP      Allergies    Patient has no known allergies.    Review of Systems   Review of Systems  Constitutional:  Negative for chills and fever.  Respiratory:  Positive for shortness of breath. Negative for cough and wheezing.   Cardiovascular:  Positive for chest pain. Negative for palpitations and leg swelling.  Gastrointestinal:  Negative for abdominal pain, nausea and vomiting.  Neurological:  Negative for weakness and numbness.  All other systems reviewed and are negative.   Physical Exam Updated Vital Signs BP 99/65   Pulse 70   Temp 98.4 F (36.9 C)   Resp (!) 21   LMP 07/15/2022 (Approximate)   SpO2 100%   Physical Exam Vitals and nursing note reviewed.  Constitutional:      Appearance: Normal appearance.  HENT:     Head: Normocephalic and atraumatic.  Eyes:     Conjunctiva/sclera: Conjunctivae normal.  Cardiovascular:     Rate and Rhythm: Normal rate and regular rhythm.  Pulmonary:     Effort: Pulmonary effort is normal. No respiratory distress.     Breath sounds: Normal breath sounds.  Abdominal:     General: There is no distension.     Palpations: Abdomen is soft.     Tenderness: There is no abdominal tenderness.  Skin:    General: Skin is warm and dry.  Neurological:     General: No focal deficit present.     Mental Status: She is alert.     ED Results / Procedures / Treatments   Labs (all labs ordered are listed, but only abnormal results are displayed) Labs Reviewed  BASIC METABOLIC PANEL  CBC  PREGNANCY, URINE  TROPONIN I (HIGH SENSITIVITY)  TROPONIN I (HIGH SENSITIVITY)    EKG EKG Interpretation  Date/Time:  Wednesday July 24 2022 18:15:10 EDT Ventricular Rate:  80 PR Interval:  158 QRS Duration: 84 QT Interval:  360 QTC Calculation: 415 R Axis:   77 Text Interpretation: Normal sinus rhythm Normal ECG When compared with ECG of 11-Oct-2018 15:07, No significant change was found Confirmed by Dorie Rank [phone])  on 07/24/2022 6:53:48 PM  Radiology DG Chest 2 View  Result Date: 07/24/2022 CLINICAL DATA:  Chest pain. Short of breath on inspiration. Ex-smoker, quitting 9 years ago. EXAM: CHEST - 2 VIEW COMPARISON:  None Available. FINDINGS: Patient rotated minimally right. Midline trachea. Normal heart size and mediastinal contours. No pleural effusion or pneumothorax. Mild hyperinflation and interstitial thickening. Clear lungs. IMPRESSION: Hyperinflation and interstitial thickening, likely related to history of smoking/chronic bronchitis. No acute superimposed process. Electronically Signed   By: Abigail Miyamoto M.D.   On: 07/24/2022 18:46    Procedures Procedures     Medications Ordered in ED Medications - No data to display  ED Course/ Medical Decision Making/ A&P                           Medical Decision Making Amount and/or Complexity of Data Reviewed Labs: ordered. Radiology: ordered.  Risk Prescription drug management.   Patient is a 31 year old female who presents the emergency department complaining of centralized chest pain for the past 2 weeks.  Emergent differential diagnosis including, but not limited to, ACS, pericarditis, aortic dissection, PE, pneumothorax, esophageal spasm or rupture, chronic angina, valvular disease, cardiomyopathy, myocarditis, pulmonary HTN, pneumonia, bronchitis, GERD, reflux/PUD, biliary disease, pancreatitis, disk disease, costochondritis, anxiety or panic attack.   On exam patient is in no acute distress.  Normal vital signs.  Heart regular rate and rhythm, lung sounds clear.  No reproducible tenderness to palpation of the anterior chest wall.  Laboratory eval patient with pertinent findings: No leukocytosis, normal hemoglobin.  Electrolytes within normal limits, normal kidney function. Initial troponin <2, delta troponin <2.  Pregnancy negative.  Chest x-ray with hyperinflation and interstitial thickening, no acute process.  Patient is to be discharged with recommendation to follow up with PCP in regards to today's hospital visit. Chest pain is not likely of cardiac or pulmonary etiology d/t presentation, PERC criteria for PE negative, VSS, no tracheal deviation, no JVD or new murmur, RRR, breath sounds equal bilaterally, EKG without acute abnormalities, negative troponin, and negative CXR. Heart score of 1.   Suspicious for esophageal spasms as patient is having some constant pressure but some intermittent cramping/spasm sensations that radiate up into her throat.  Also having some difficulty swallowing thicker textured foods.  Will try short course of Bentyl for symptom muscle relaxation, and send referral  to gastroenterology.  Pt has been advised to return to the ED if CP becomes exertional, associated with diaphoresis or nausea, radiates to left jaw/arm, worsens or becomes concerning in any way. Pt appears reliable for follow up and is agreeable to discharge.   Final Clinical Impression(s) / ED Diagnoses Final diagnoses:  Chest pain, unspecified type  Difficulty swallowing solids    Rx / DC Orders ED Discharge Orders          Ordered    dicyclomine (BENTYL) 20 MG tablet  2 times daily        07/24/22 2046    Ambulatory referral to Gastroenterology        07/24/22 2052           Portions of this report may have been transcribed using voice recognition software. Every effort was made to ensure accuracy; however, inadvertent computerized transcription errors may be present.    Estill Cotta 07/24/22 2304    Dorie Rank, MD 07/29/22 [phone]

## 2022-07-24 NOTE — ED Notes (Signed)
Discharge paperwork given and verbally understood. 

## 2022-07-24 NOTE — Discharge Instructions (Addendum)
You are seen the emergency department today for chest pain.  As we discussed I think your symptoms could be related to esophageal spasms.  These can be very painful take her breath away.  One way to treat this is by treating reflux symptoms see you can take something like Pepcid or Prilosec over-the-counter.  Would like to try a medicine called Bentyl which can help with some smooth muscle relaxation.  I have also sent a referral to the gastroenterologist for you to make a follow-up appointment.  Continue to monitor how you're doing and return to the ER for new or worsening symptoms.

## 2022-07-24 NOTE — ED Triage Notes (Signed)
Chest pain for last 2 weeks, feels squeezing pressure mid sternal pains. "I cant catch my breathe" when it happens. Constant without relief

## 2022-11-28 ENCOUNTER — Encounter: Payer: Self-pay | Admitting: Gastroenterology

## 2022-11-28 ENCOUNTER — Other Ambulatory Visit: Payer: Self-pay

## 2022-11-28 ENCOUNTER — Ambulatory Visit (INDEPENDENT_AMBULATORY_CARE_PROVIDER_SITE_OTHER): Payer: 59 | Admitting: Gastroenterology

## 2022-11-28 VITALS — BP 110/73 | HR 76 | Temp 99.4°F | Ht 65.0 in | Wt 140.6 lb

## 2022-11-28 DIAGNOSIS — R1310 Dysphagia, unspecified: Secondary | ICD-10-CM

## 2022-11-28 DIAGNOSIS — N871 Moderate cervical dysplasia: Secondary | ICD-10-CM | POA: Insufficient documentation

## 2022-11-28 DIAGNOSIS — G930 Cerebral cysts: Secondary | ICD-10-CM | POA: Insufficient documentation

## 2022-11-28 DIAGNOSIS — Z9889 Other specified postprocedural states: Secondary | ICD-10-CM | POA: Insufficient documentation

## 2022-11-28 DIAGNOSIS — N889 Noninflammatory disorder of cervix uteri, unspecified: Secondary | ICD-10-CM | POA: Insufficient documentation

## 2022-11-28 NOTE — Progress Notes (Signed)
Tricia Bellows MD, MRCP(U.K) [address]  [place]  [place],  [address]  Main: [phone]  Fax: [phone]   Gastroenterology Consultation  Referring Provider:     Flossie Buffy, Tricia Wallace Primary Care Physician:  Tricia Buffy, Tricia Wallace Primary Gastroenterologist:  Dr. Jonathon Wallace  Reason for Consultation:     esophageal spasm         HPI:   Tricia Wallace is a 31 y.o. y/o female presented to the ER on 07/24/2022 with chest pain of 2 weeks duration with difficulty swallowing . CBC, CMP- normal. CXR showed features of chronic bronchitis.   She says that for many months she has had discomfort in her chest points to the center of her chest every time she eats versus solids than liquids but also occurs with liquids.  She feels that food sometimes goes down very slowly when she swallows.  Particular foods such as bread and rice are worse.  Does not smoke but vapes.  Was prescribed PPI but has not tried it she is very averse to words medications. Past Medical History:  Diagnosis Date   Abnormal Pap smear 04/2008   LEEP   BV (bacterial vaginosis) 2009   CIN II (cervical intraepithelial neoplasia II) 2009   First trimester bleeding 06/07/2009   GBS carrier    H/O sciatica    H/O varicella    H/O: menorrhagia 2011   History of preterm delivery, currently pregnant 01/06/2013   Hx: UTI (urinary tract infection)    Labial tear 07/04/09   Marijuana use    History of use   Periurethral tear in female 07/04/09   S/P LEEP    Smoker    Vaginal Pap smear, abnormal    ok since LEEP    Past Surgical History:  Procedure Laterality Date   CESAREAN SECTION N/A 01/28/2013   Procedure: CESAREAN SECTION;  Surgeon: Tricia Dawley, MD;  Location: [facility];  Service: Obstetrics;  Laterality: N/A;   DILATION AND CURETTAGE OF UTERUS     LEEP     OTHER SURGICAL HISTORY     egg retrieval for IVF   TONSILLECTOMY     WISDOM TOOTH EXTRACTION      Prior to Admission medications    Medication Sig Start Date End Date Taking? Authorizing Provider  dicyclomine (BENTYL) 20 MG tablet Take 1 tablet (20 mg total) by mouth 2 (two) times daily. 07/24/22   Roemhildt, Lorin T, PA-C  oseltamivir (TAMIFLU) 75 MG capsule Take 1 capsule (75 mg total) by mouth 2 (two) times daily. 03/13/20   [name], Tricia Brooke, Tricia Wallace  pantoprazole (PROTONIX) 40 MG tablet Take 1 tablet by mouth at bedtime.    [provider]  progesterone (PROMETRIUM) 200 MG capsule Take 200 mg by mouth daily.    [provider]  promethazine (PHENERGAN) 25 MG tablet Take 25 mg by mouth every 4 (four) hours as needed.    [provider]  triamcinolone cream (KENALOG) 0.1 % Apply 1 Application topically 2 (two) times daily.    [provider]    Family History  Problem Relation Age of Onset   Diabetes Brother    Cancer Maternal Grandfather        Lung   Cancer Maternal Aunt 39       breast   Asthma Daughter      Social History   Tobacco Use   Smoking status: Former    Packs/day: 1.50    Types: Cigarettes  Quit date: 01/02/2013    Years since quitting: 9.9   Smokeless tobacco: Never  Vaping Use   Vaping Use: Never used  Substance Use Topics   Alcohol use: No   Drug use: No    Allergies as of 11/28/2022   (No Known Allergies)    Review of Systems:    All systems reviewed and negative except where noted in HPI.   Physical Exam:  BP 110/73   Pulse 76   Temp 99.4 F (37.4 C) (Oral)   Ht '5\' 5"'$  (1.651 m)   Wt 140 lb 9.6 oz (63.8 kg)   BMI 23.40 kg/m  No LMP recorded. Psych:  Alert and cooperative. Normal mood and affect. General:   Alert,  Well-developed, well-nourished, pleasant and cooperative in NAD Neurologic:  Alert and oriented x3;  grossly normal neurologically. Psych:  Alert and cooperative. Normal mood and affect.  Imaging Studies: No results found.  Assessment and Plan:   Tricia Wallace is a 31 y.o. y/o female has been referred for chest  discomfort and difficulty swallowing.  Differentials include an esophageal spasm versus eosinophilic esophagitis versus GERD.  Plan   1.  Will provide patient information on GERD 2.  Advised to start on omeprazole over-the-counter 20 mg in the morning and 20 mg at night 3.  EGD next week to rule out eosinophilic esophagitis 4.  If trial of PPI and EGD showed no abnormalities then would need esophageal manometry   I have discussed alternative options, risks & benefits,  which include, but are not limited to, bleeding, infection, perforation,respiratory complication & drug reaction.  The patient agrees with this plan & written consent will be obtained.     Follow up in 4 weeks  Dr Tricia Bellows MD,MRCP(U.K)

## 2022-11-28 NOTE — Patient Instructions (Signed)

## 2022-12-12 ENCOUNTER — Telehealth: Payer: Self-pay

## 2022-12-12 NOTE — Telephone Encounter (Signed)
Patient called stating that the entire family is sick with the flu. She stated that she will call us back once everybody was healthy. The endoscopy unit was notified and Wannetta Sender stated that she will cancel it.

## 2022-12-12 NOTE — Telephone Encounter (Signed)
Patient called and left a voicemail wanting to cancel her procedure for tomorrow. I then called her back and had to leave her a voicemail to call me back so I could cancelled and reschedule her EGD.

## 2022-12-13 ENCOUNTER — Ambulatory Visit: Admission: RE | Admit: 2022-12-13 | Payer: 59 | Source: Home / Self Care | Admitting: Gastroenterology

## 2022-12-13 ENCOUNTER — Encounter: Admission: RE | Payer: Self-pay | Source: Home / Self Care

## 2022-12-13 SURGERY — ESOPHAGOGASTRODUODENOSCOPY (EGD) WITH PROPOFOL
Anesthesia: General

## 2022-12-20 NOTE — Telephone Encounter (Signed)
Spoke to patient and I was able to document on the other telephone encounter from 12/12/2022.

## 2022-12-31 ENCOUNTER — Other Ambulatory Visit: Payer: Self-pay

## 2022-12-31 ENCOUNTER — Ambulatory Visit: Payer: Self-pay | Admitting: Gastroenterology

## 2022-12-31 DIAGNOSIS — R1310 Dysphagia, unspecified: Secondary | ICD-10-CM

## 2023-01-06 DIAGNOSIS — Z01419 Encounter for gynecological examination (general) (routine) without abnormal findings: Secondary | ICD-10-CM | POA: Diagnosis not present

## 2023-01-06 DIAGNOSIS — Z3040 Encounter for surveillance of contraceptives, unspecified: Secondary | ICD-10-CM | POA: Diagnosis not present

## 2023-01-06 DIAGNOSIS — Z803 Family history of malignant neoplasm of breast: Secondary | ICD-10-CM | POA: Insufficient documentation

## 2023-01-06 DIAGNOSIS — Z6823 Body mass index (BMI) 23.0-23.9, adult: Secondary | ICD-10-CM | POA: Diagnosis not present

## 2023-01-30 ENCOUNTER — Ambulatory Visit: Payer: 59 | Admitting: Nurse Practitioner

## 2023-01-30 ENCOUNTER — Ambulatory Visit: Admission: RE | Admit: 2023-01-30 | Payer: 59 | Source: Home / Self Care | Admitting: Gastroenterology

## 2023-01-30 ENCOUNTER — Encounter: Admission: RE | Payer: Self-pay | Source: Home / Self Care

## 2023-01-30 SURGERY — ESOPHAGOGASTRODUODENOSCOPY (EGD) WITH PROPOFOL
Anesthesia: General

## 2023-02-10 ENCOUNTER — Ambulatory Visit: Payer: Self-pay | Admitting: Gastroenterology

## 2023-02-10 DIAGNOSIS — R399 Unspecified symptoms and signs involving the genitourinary system: Secondary | ICD-10-CM | POA: Diagnosis not present

## 2023-02-10 DIAGNOSIS — N390 Urinary tract infection, site not specified: Secondary | ICD-10-CM | POA: Diagnosis not present

## 2023-06-03 DIAGNOSIS — Z09 Encounter for follow-up examination after completed treatment for conditions other than malignant neoplasm: Secondary | ICD-10-CM | POA: Diagnosis not present

## 2023-06-03 DIAGNOSIS — D485 Neoplasm of uncertain behavior of skin: Secondary | ICD-10-CM | POA: Diagnosis not present

## 2023-06-03 DIAGNOSIS — D225 Melanocytic nevi of trunk: Secondary | ICD-10-CM | POA: Diagnosis not present

## 2023-06-03 DIAGNOSIS — L821 Other seborrheic keratosis: Secondary | ICD-10-CM | POA: Diagnosis not present

## 2023-06-03 DIAGNOSIS — Z872 Personal history of diseases of the skin and subcutaneous tissue: Secondary | ICD-10-CM | POA: Diagnosis not present

## 2023-06-03 DIAGNOSIS — L814 Other melanin hyperpigmentation: Secondary | ICD-10-CM | POA: Diagnosis not present

## 2023-06-05 ENCOUNTER — Encounter: Payer: Self-pay | Admitting: Nurse Practitioner

## 2023-06-05 ENCOUNTER — Ambulatory Visit: Payer: 59 | Attending: Nurse Practitioner

## 2023-06-05 ENCOUNTER — Ambulatory Visit: Payer: 59 | Admitting: Nurse Practitioner

## 2023-06-05 VITALS — BP 140/100 | HR 82 | Temp 98.2°F | Resp 16 | Ht 65.0 in | Wt 144.2 lb

## 2023-06-05 DIAGNOSIS — I471 Supraventricular tachycardia, unspecified: Secondary | ICD-10-CM

## 2023-06-05 DIAGNOSIS — F17290 Nicotine dependence, other tobacco product, uncomplicated: Secondary | ICD-10-CM

## 2023-06-05 DIAGNOSIS — R0602 Shortness of breath: Secondary | ICD-10-CM

## 2023-06-05 DIAGNOSIS — R002 Palpitations: Secondary | ICD-10-CM

## 2023-06-05 DIAGNOSIS — R8290 Unspecified abnormal findings in urine: Secondary | ICD-10-CM | POA: Diagnosis not present

## 2023-06-05 DIAGNOSIS — D3131 Benign neoplasm of right choroid: Secondary | ICD-10-CM | POA: Diagnosis not present

## 2023-06-05 DIAGNOSIS — H5213 Myopia, bilateral: Secondary | ICD-10-CM | POA: Diagnosis not present

## 2023-06-05 HISTORY — DX: Shortness of breath: R06.02

## 2023-06-05 HISTORY — DX: Nicotine dependence, other tobacco product, uncomplicated: F17.290

## 2023-06-05 LAB — CBC WITH DIFFERENTIAL/PLATELET
Basophils Absolute: 0 10*3/uL (ref 0.0–0.1)
Basophils Relative: 0.5 % (ref 0.0–3.0)
Eosinophils Absolute: 0.1 10*3/uL (ref 0.0–0.7)
Eosinophils Relative: 1.2 % (ref 0.0–5.0)
HCT: 37.3 % (ref 36.0–46.0)
Hemoglobin: 12.3 g/dL (ref 12.0–15.0)
Lymphocytes Relative: 30 % (ref 12.0–46.0)
Lymphs Abs: 1.8 10*3/uL (ref 0.7–4.0)
MCHC: 33 g/dL (ref 30.0–36.0)
MCV: 89 fl (ref 78.0–100.0)
Monocytes Absolute: 0.4 10*3/uL (ref 0.1–1.0)
Monocytes Relative: 7.1 % (ref 3.0–12.0)
Neutro Abs: 3.6 10*3/uL (ref 1.4–7.7)
Neutrophils Relative %: 61.2 % (ref 43.0–77.0)
Platelets: 242 10*3/uL (ref 150.0–400.0)
RBC: 4.19 Mil/uL (ref 3.87–5.11)
RDW: 13.2 % (ref 11.5–15.5)
WBC: 5.9 10*3/uL (ref 4.0–10.5)

## 2023-06-05 LAB — TSH: TSH: 1.35 u[IU]/mL (ref 0.35–5.50)

## 2023-06-05 LAB — POC URINALSYSI DIPSTICK (AUTOMATED)
Bilirubin, UA: NEGATIVE
Glucose, UA: NEGATIVE
Ketones, UA: NEGATIVE
Nitrite, UA: NEGATIVE
Protein, UA: NEGATIVE
Spec Grav, UA: 1.025 (ref 1.010–1.025)
Urobilinogen, UA: 0.2 E.U./dL
pH, UA: 6 (ref 5.0–8.0)

## 2023-06-05 LAB — COMPREHENSIVE METABOLIC PANEL
ALT: 9 U/L (ref 0–35)
AST: 15 U/L (ref 0–37)
Albumin: 4.3 g/dL (ref 3.5–5.2)
Alkaline Phosphatase: 69 U/L (ref 39–117)
BUN: 8 mg/dL (ref 6–23)
CO2: 27 mEq/L (ref 19–32)
Calcium: 9.4 mg/dL (ref 8.4–10.5)
Chloride: 104 mEq/L (ref 96–112)
Creatinine, Ser: 0.78 mg/dL (ref 0.40–1.20)
GFR: 100.07 mL/min (ref >60.00)
Glucose, Bld: 96 mg/dL (ref 70–99)
Potassium: 4.9 mEq/L (ref 3.5–5.1)
Sodium: 137 mEq/L (ref 135–145)
Total Bilirubin: 0.4 mg/dL (ref 0.2–1.2)
Total Protein: 7.4 g/dL (ref 6.0–8.3)

## 2023-06-05 NOTE — Progress Notes (Unsigned)
Enrolled patient for a 3 day Zio XT monitor to be mailed to patients home   DOD to read 

## 2023-06-05 NOTE — Patient Instructions (Addendum)
Go to [address] for CXR Go to lab You will be contacted to schedule appointment for pulmonary test and holter monitor. Work on nicotine and caffeine cessation  Palpitations Palpitations are feelings that your heartbeat is irregular or is faster than normal. It may feel like your heart is fluttering or skipping a beat. Palpitations may be caused by many things, including smoking, caffeine, alcohol, stress, and certain medicines or drugs. Most causes of palpitations are not serious.  However, some palpitations can be a sign of a serious problem. Further tests and a thorough medical history will be done to find the cause of your palpitations. Your provider may order tests such as an ECG, labs, an echocardiogram, or an ambulatory continuous ECG monitor. Follow these instructions at home: Pay attention to any changes in your symptoms. Let your health care provider know about them. Take these actions to help manage your symptoms: Eating and drinking Follow instructions from your health care provider about eating or drinking restrictions. You may need to avoid foods and drinks that may cause palpitations. These may include: Caffeinated coffee, tea, soft drinks, and energy drinks. Chocolate. Alcohol. Diet pills. Lifestyle     Take steps to reduce your stress and anxiety. Things that can help you relax include: Yoga. Mind-body activities, such as deep breathing, meditation, or using words and images to create positive thoughts (guided imagery). Physical activity, such as swimming, jogging, or walking. Tell your health care provider if your palpitations increase with activity. If you have chest pain or shortness of breath with activity, do not continue the activity until you are seen by your health care provider. Biofeedback. This is a method that helps you learn to use your mind to control things in your body, such as your heartbeat. Get plenty of rest and sleep. Keep a regular bed time. Do  not use drugs, including cocaine or ecstasy. Do not use marijuana. Do not use any products that contain nicotine or tobacco. These products include cigarettes, chewing tobacco, and vaping devices, such as e-cigarettes. If you need help quitting, ask your health care provider. General instructions Take over-the-counter and prescription medicines only as told by your health care provider. Keep all follow-up visits. This is important. These may include visits for further testing if palpitations do not go away or get worse. Contact a health care provider if: You continue to have a fast or irregular heartbeat for a long period of time. You notice that your palpitations occur more often. Get help right away if: You have chest pain or shortness of breath. You have a severe headache. You feel dizzy or you faint. These symptoms may represent a serious problem that is an emergency. Do not wait to see if the symptoms will go away. Get medical help right away. Call your local emergency services (911 in the [place] Do not drive yourself to the hospital. Summary Palpitations are feelings that your heartbeat is irregular or is faster than normal. It may feel like your heart is fluttering or skipping a beat. Palpitations may be caused by many things, including smoking, caffeine, alcohol, stress, certain medicines, and drugs. Further tests and a thorough medical history may be done to find the cause of your palpitations. Get help right away if you faint or have chest pain, shortness of breath, severe headache, or dizziness. This information is not intended to replace advice given to you by your health care provider. Make sure you discuss any questions you have with your health care  provider. Document Revised: 05/02/2021 Document Reviewed: 05/02/2021 Elsevier Patient Education  2024 [employer].

## 2023-06-05 NOTE — Progress Notes (Signed)
Established Patient Visit  Patient: Tricia Wallace   DOB: 02-04-1991   32 y.o. Female  MRN: [id number] Visit Date: 06/05/2023  Subjective:    Chief Complaint  Patient presents with   Shortness of Breath   Palpitations    Pt c/o chest pains, sob, feeling winded on exertion, palpitations and swelling on right side.  This started August 23 but has gotten worse in the beginning of the year.    Nicotine dependence due to vaping tobacco product X30yrs vaping daily , 5mg  of nicotine, 1cartridge (contains 500puffs)every 2 days. Advised about importance of quitting due to risk of lund disease, cancer, and CVD. She declined need for medication. She plans to quit on her own  SOB (shortness of breath) on exertion Onset 6months ago, gradually worsening, triggered by exertion Associated with palpitations, chest pain, and burning sensation in throat Symptoms resolve with rest. She is also concerned about weight gain (16lbs weight gain in last 37yrs) She stopped exercise routine 6months ago due to symptoms. Previously exercised daily (2hrs each day, cardio and weight training). ED visit 07/2022 due to above symptoms: normal ECG, troponin, CBC, and BMP. Abnormal CXR Caffeine: soda x 6cans daily No ALCOHOL or marijuana or any illicit drug use. No Fhx of premature CAD  Repeat CBC, CMP, THYROID, and CXR Order PFT and holter monitor Advised to avoid caffeine and stop nicotine use.  Wt Readings from Last 3 Encounters:  06/05/23 144 lb 3.2 oz (65.4 kg)  11/28/22 140 lb 9.6 oz (63.8 kg)  03/13/20 128 lb (58.1 kg)    BP Readings from Last 3 Encounters:  06/05/23 (!) 140/100  11/28/22 110/73  07/24/22 99/65    Reviewed medical, surgical, and social history today  Medications: No outpatient medications prior to visit.   No facility-administered medications prior to visit.   Reviewed past medical and social history.   ROS per HPI above      Objective:  BP (!) 140/100 (BP  Location: Left Arm, Patient Position: Sitting, Cuff Size: Large)   Pulse 82   Temp 98.2 F (36.8 C) (Temporal)   Resp 16   Ht 5\' 5"  (1.651 m)   Wt 144 lb 3.2 oz (65.4 kg)   LMP 04/28/2023 (Approximate)   SpO2 100%   BMI 24.00 kg/m      Physical Exam Neck:     Thyroid: No thyroid mass, thyromegaly or thyroid tenderness.  Cardiovascular:     Rate and Rhythm: Normal rate and regular rhythm.     Pulses: Normal pulses.     Heart sounds: Normal heart sounds.  Pulmonary:     Effort: Pulmonary effort is normal.     Breath sounds: Normal breath sounds.  Musculoskeletal:     Cervical back: Normal range of motion and neck supple.     Right lower leg: No edema.     Left lower leg: No edema.  Lymphadenopathy:     Cervical: No cervical adenopathy.  Neurological:     Mental Status: She is alert and oriented to person, place, and time.  Psychiatric:        Attention and Perception: Attention normal.        Mood and Affect: Affect normal. Mood is anxious.        Speech: Speech normal.        Behavior: Behavior is cooperative.        Thought Content: Thought content normal.  Cognition and Memory: Cognition and memory normal.        Judgment: Judgment normal.     Results for orders placed or performed in visit on 06/05/23  POCT Urinalysis Dipstick (Automated)  Result Value Ref Range   Color, UA Yellow    Clarity, UA Cloudy    Glucose, UA Negative Negative   Bilirubin, UA Negative    Ketones, UA Negative    Spec Grav, UA 1.025 1.010 - 1.025   Blood, UA 1+    pH, UA 6.0 5.0 - 8.0   Protein, UA Negative Negative   Urobilinogen, UA 0.2 0.2 or 1.0 E.U./dL   Nitrite, UA neg    Leukocytes, UA Large (3+) (A) Negative      Assessment & Plan:    Problem List Items Addressed This Visit       Other   Nicotine dependence due to vaping tobacco product    X44yrs vaping daily , 5mg  of nicotine, 1cartridge (contains 500puffs)every 2 days. Advised about importance of quitting due  to risk of lund disease, cancer, and CVD. She declined need for medication. She plans to quit on her own      Relevant Orders   Pulmonary function test   DG Chest 2 View   SOB (shortness of breath) on exertion    Onset 6months ago, gradually worsening, triggered by exertion Associated with palpitations, chest pain, and burning sensation in throat Symptoms resolve with rest. She is also concerned about weight gain (16lbs weight gain in last 35yrs) She stopped exercise routine 6months ago due to symptoms. Previously exercised daily (2hrs each day, cardio and weight training). ED visit 07/2022 due to above symptoms: normal ECG, troponin, CBC, and BMP. Abnormal CXR Caffeine: soda x 6cans daily No ALCOHOL or marijuana or any illicit drug use. No Fhx of premature CAD  Repeat CBC, CMP, THYROID, and CXR Order PFT and holter monitor Advised to avoid caffeine and stop nicotine use.      Relevant Orders   CBC with Differential/Platelet   Comprehensive metabolic panel   TSH   Pulmonary function test   Holter monitor - 72 hour   DG Chest 2 View   Other Visit Diagnoses     Palpitations    -  Primary   Relevant Orders   POCT Urinalysis Dipstick (Automated) (Completed)   CBC with Differential/Platelet   Comprehensive metabolic panel   TSH   Pulmonary function test   Holter monitor - 72 hour   Abnormal finding on urinalysis       Relevant Orders   Urine Culture      Return in about 3 months (around 09/05/2023) for CPE (fasting).     Alysia Penna, NP

## 2023-06-05 NOTE — Assessment & Plan Note (Signed)
X71yrs vaping daily , 5mg  of nicotine, 1cartridge (contains 500puffs)every 2 days. Advised about importance of quitting due to risk of lund disease, cancer, and CVD. She declined need for medication. She plans to quit on her own

## 2023-06-05 NOTE — Assessment & Plan Note (Signed)
Onset 6months ago, gradually worsening, triggered by exertion Associated with palpitations, chest pain, and burning sensation in throat Symptoms resolve with rest. She is also concerned about weight gain (16lbs weight gain in last 64yrs) She stopped exercise routine 6months ago due to symptoms. Previously exercised daily (2hrs each day, cardio and weight training). ED visit 07/2022 due to above symptoms: normal ECG, troponin, CBC, and BMP. Abnormal CXR Caffeine: soda x 6cans daily No ALCOHOL or marijuana or any illicit drug use. No Fhx of premature CAD  Repeat CBC, CMP, THYROID, and CXR Order PFT and holter monitor Advised to avoid caffeine and stop nicotine use.

## 2023-06-05 NOTE — Progress Notes (Signed)
Stable Follow instructions as discussed during office visit.

## 2023-06-06 LAB — URINE CULTURE
MICRO NUMBER:: 15079203
SPECIMEN QUALITY:: ADEQUATE

## 2023-06-09 DIAGNOSIS — R0602 Shortness of breath: Secondary | ICD-10-CM

## 2023-06-09 DIAGNOSIS — R002 Palpitations: Secondary | ICD-10-CM

## 2023-06-18 DIAGNOSIS — R0602 Shortness of breath: Secondary | ICD-10-CM | POA: Diagnosis not present

## 2023-06-18 DIAGNOSIS — R002 Palpitations: Secondary | ICD-10-CM | POA: Diagnosis not present

## 2023-06-19 ENCOUNTER — Ambulatory Visit (INDEPENDENT_AMBULATORY_CARE_PROVIDER_SITE_OTHER): Payer: 59

## 2023-06-19 DIAGNOSIS — I471 Supraventricular tachycardia, unspecified: Secondary | ICD-10-CM | POA: Insufficient documentation

## 2023-06-19 DIAGNOSIS — R002 Palpitations: Secondary | ICD-10-CM

## 2023-06-19 NOTE — Addendum Note (Signed)
Addended by: Alysia Penna L on: 06/19/2023 01:17 PM   Modules accepted: Orders

## 2023-06-19 NOTE — Progress Notes (Signed)
EKG completed and placed on providers desk to be review upon returning in the morning. Dm/cma

## 2023-06-24 ENCOUNTER — Ambulatory Visit: Payer: 59 | Attending: Cardiology | Admitting: Cardiology

## 2023-06-24 ENCOUNTER — Encounter: Payer: Self-pay | Admitting: Cardiology

## 2023-06-24 VITALS — BP 126/80 | HR 75 | Ht 65.0 in | Wt 148.8 lb

## 2023-06-24 DIAGNOSIS — R0602 Shortness of breath: Secondary | ICD-10-CM

## 2023-06-24 DIAGNOSIS — M7989 Other specified soft tissue disorders: Secondary | ICD-10-CM | POA: Diagnosis not present

## 2023-06-24 DIAGNOSIS — R002 Palpitations: Secondary | ICD-10-CM

## 2023-06-24 NOTE — Patient Instructions (Signed)
Medication Instructions:  Continue same medications   Lab Work: None ordered   Testing/Procedures: Echo  Lower Ext Venous Dopplers   Follow-Up: At [facility], you and your health needs are our priority.  As part of our continuing mission to provide you with exceptional heart care, we have created designated Provider Care Teams.  These Care Teams include your primary Cardiologist (physician) and Advanced Practice Providers (APPs -  Physician Assistants and Nurse Practitioners) who all work together to provide you with the care you need, when you need it.  We recommend signing up for the patient portal called "MyChart".  Sign up information is provided on this After Visit Summary.  MyChart is used to connect with patients for Virtual Visits (Telemedicine).  Patients are able to view lab/test results, encounter notes, upcoming appointments, etc.  Non-urgent messages can be sent to your provider as well.   To learn more about what you can do with MyChart, go to [email or web address].    Your next appointment:  To Be Determined after test    Provider:  Dr.Jordan

## 2023-06-24 NOTE — Progress Notes (Signed)
Cardiology Office Note:    Date:  06/24/2023   ID:  Tricia Wallace, DOB [birth year], MRN [id number]  PCP:  Anne Ng, Wallace   [facility] Providers Cardiologist:  None     Referring MD: Anne Ng, Wallace   Chief Complaint  Patient presents with   Shortness of Breath   Palpitations    History of Present Illness:    Tricia Wallace is a 32 y.o. female seen at the request of Tricia Wallace for evaluation of SOB and PAT. She reports concern about her heart since last August. She states at that time she went to the ED because she had severe chest tightness and SOB. Evaluation in ED including troponin levels and Ecg were normal. CXR suggested chronic bronchitis. She denied cough. Since then she has experienced ongoing SOB, chest tightness, and palpitations. States her heart will race typically less than one minute. Notes swelling in right leg. SOB is all the time but worse with activity. States she had to quit going to the gym and has gained 20 lbs. She was drinking up to 8 Cokes a day. Has a history of smoking but more Vaping over the past 6 years. She is very concerned that she has something very serious wrong with her heart or lungs.   Past Medical History:  Diagnosis Date   Abnormal Pap smear 04/2008   LEEP   BV (bacterial vaginosis) 2009   CIN II (cervical intraepithelial neoplasia II) 2009   First trimester bleeding 06/07/2009   GBS carrier    H/O sciatica    H/O varicella    H/O: menorrhagia 2011   History of preterm delivery, currently pregnant 01/06/2013   Hx: UTI (urinary tract infection)    Labial tear 07/04/09   Marijuana use    History of use   Periurethral tear in female 07/04/09   S/P LEEP    Smoker    Vaginal Pap smear, abnormal    ok since LEEP    Past Surgical History:  Procedure Laterality Date   CESAREAN SECTION N/A 01/28/2013   Procedure: CESAREAN SECTION;  Surgeon: Kirkland Hun, MD;  Location: [facility];  Service: Obstetrics;   Laterality: N/A;   DILATION AND CURETTAGE OF UTERUS     LEEP     OTHER SURGICAL HISTORY     egg retrieval for IVF   TONSILLECTOMY     WISDOM TOOTH EXTRACTION      Current Medications: No outpatient medications have been marked as taking for the 06/24/23 encounter (Office Visit) with Tricia Wallace, Analina Filla M, MD.     Allergies:   Patient has no known allergies.   Social History   Socioeconomic History   Marital status: Married    Spouse name: Not on file   Number of children: 4   Years of education: Not on file   Highest education level: Not on file  Occupational History   Not on file  Tobacco Use   Smoking status: Every Day    Packs/day: 1.50    Years: 6.00    Additional pack years: 0.00    Total pack years: 9.00    Types: Cigarettes, E-cigarettes   Smokeless tobacco: Never  Vaping Use   Vaping Use: Every day   Substances: Nicotine  Substance and Sexual Activity   Alcohol use: No   Drug use: No   Sexual activity: Yes    Birth control/protection: None  Other Topics Concern   Not on file  Social  History Narrative   Home health for [facility]   Social Determinants of Health   Financial Resource Strain: Not on file  Food Insecurity: Not on file  Transportation Needs: Not on file  Physical Activity: Not on file  Stress: Not on file  Social Connections: Not on file     Family History: The patient's family history includes Asthma in her daughter; Cancer in her maternal grandfather; Cancer (age of onset: 39) in her maternal aunt; Cancer (age of onset: 22) in her mother; Cancer (age of onset: 70) in her maternal grandmother; Diabetes in her brother.  ROS:   Please see the history of present illness.     All other systems reviewed and are negative.  EKGs/Labs/Other Studies Reviewed:    The following studies were reviewed today: Holter monitor: 06/18/23: Study Highlights      Predominant rhythm was normal sinus rhythm with an average heart rate of 84 bpm and ranged from  53 to 149 bpm   Paroxysmal atrial tachycardia lasting as long as 8 beats with a maximum heart rate of 113 bpm   Rare PACs     Patch Wear Time:  2 days and 12 hours (2024-06-17T20:40:58-0400 to 2024-06-20T09:29:03-399)   Patient had a min HR of 53 bpm, max HR of 149 bpm, and avg HR of 84 bpm. Predominant underlying rhythm was Sinus Rhythm. 2 Supraventricular Tachycardia runs occurred, the run with the fastest interval lasting 8 beats with a max rate of 113 bpm (avg 103  bpm); the run with the fastest interval was also the longest. Some episodes of Supraventricular Tachycardia may be possible Atrial Tachycardia with variable block. Isolated SVEs were rare (<1.0%), and no SVE Couplets or SVE Triplets were present. No  Isolated VEs, VE Couplets, or VE Triplets were present.   EKG Interpretation Date/Time:  Tuesday June 24 2023 08:10:35 EDT Ventricular Rate:  75 PR Interval:  164 QRS Duration:  90 QT Interval:  394 QTC Calculation: 439 R Axis:   69  Text Interpretation: Normal sinus rhythm with sinus arrhythmia Normal ECG When compared with ECG of 24-Jul-2022 18:15, No significant change was found Confirmed by Tricia Wallace, Tricia Wallace [phone]) on 06/24/2023 8:14:15 AM  EKG Interpretation Date/Time:  Tuesday June 24 2023 08:10:35 EDT Ventricular Rate:  75 PR Interval:  164 QRS Duration:  90 QT Interval:  394 QTC Calculation: 439 R Axis:   69  Text Interpretation: Normal sinus rhythm with sinus arrhythmia Normal ECG When compared with ECG of 24-Jul-2022 18:15, No significant change was found Confirmed by Tricia Wallace, Tricia Wallace [phone]) on 06/24/2023 8:14:15 AM    Recent Labs: 06/05/2023: ALT 9; BUN 8; Creatinine, Ser 0.78; Hemoglobin 12.3; Platelets 242.0; Potassium 4.9; Sodium 137; TSH 1.35  Recent Lipid Panel    Component Value Date/Time   CHOL 172 01/16/2018 1627   TRIG 162 (H) 01/16/2018 1627   HDL 53 01/16/2018 1627   CHOLHDL 3.2 01/16/2018 1627   LDLCALC 92 01/16/2018 1627     Risk  Assessment/Calculations:                Physical Exam:    VS:  BP 126/80 (BP Location: Left Arm, Patient Position: Sitting, Cuff Size: Normal)   Pulse 75   Ht 5\' 5"  (1.651 m)   Wt 148 lb 12.8 oz (67.5 kg)   LMP 04/28/2023 (Approximate)   SpO2 98%   BMI 24.76 kg/m     Wt Readings from Last 3 Encounters:  06/24/23 148 lb 12.8 oz (67.5 kg)  06/05/23 144 lb 3.2 oz (65.4 kg)  11/28/22 140 lb 9.6 oz (63.8 kg)     GEN:  Well nourished, well developed in no acute distress HEENT: Normal NECK: No JVD; No carotid bruits LYMPHATICS: No lymphadenopathy CARDIAC: RRR, no murmurs, rubs, gallops RESPIRATORY:  Clear to auscultation without rales, wheezing or rhonchi  ABDOMEN: Soft, non-tender, non-distended MUSCULOSKELETAL:  No edema; No deformity  SKIN: Warm and dry NEUROLOGIC:  Alert and oriented x 3 PSYCHIATRIC:  Normal affect   ASSESSMENT:    1. Palpitations   2. Shortness of breath   3. Right leg swelling    PLAN:    In order of problems listed above:  Palpitations. Holter 3 day study showed a single episode of PAT lasting 8 beats. She had rare PACs. Symptoms did not always correlate with arrhythmia. Overall her Holter is benign. Recommend avoiding stimulants such as nicotine and caffeine. Regular aerobic activity. No additional therapy needed at this time SOB.  Most likely related to chronic smoking/vaping. Doubt cardiac. Will check Echo to assess LV function/valves. Will also check LE venous doppler to rule out DVT since she notes right leg swelling. If these studies are normal then would consider pulmonary evaluation for asthma.      Medication Adjustments/Labs and Tests Ordered: Current medicines are reviewed at length with the patient today.  Concerns regarding medicines are outlined above.  Orders Placed This Encounter  Procedures   EKG 12-Lead   No orders of the defined types were placed in this encounter.   There are no Patient Instructions on file for this  visit.   Signed, Kamree Wiens [place], MD  06/24/2023 8:32 AM    [facility]

## 2023-07-02 ENCOUNTER — Ambulatory Visit (HOSPITAL_COMMUNITY)
Admission: RE | Admit: 2023-07-02 | Discharge: 2023-07-02 | Disposition: A | Discharge status: Home / Self Care | Payer: 59 | Source: Ambulatory Visit | Attending: Cardiology | Admitting: Cardiology

## 2023-07-02 DIAGNOSIS — M7989 Other specified soft tissue disorders: Secondary | ICD-10-CM | POA: Insufficient documentation

## 2023-07-02 DIAGNOSIS — R002 Palpitations: Secondary | ICD-10-CM | POA: Insufficient documentation

## 2023-07-02 DIAGNOSIS — R0602 Shortness of breath: Secondary | ICD-10-CM | POA: Diagnosis not present

## 2023-07-22 ENCOUNTER — Ambulatory Visit (HOSPITAL_COMMUNITY): Payer: 59 | Attending: Cardiovascular Disease

## 2023-07-22 ENCOUNTER — Telehealth: Payer: Self-pay | Admitting: Nurse Practitioner

## 2023-07-22 DIAGNOSIS — R002 Palpitations: Secondary | ICD-10-CM | POA: Insufficient documentation

## 2023-07-22 DIAGNOSIS — M7989 Other specified soft tissue disorders: Secondary | ICD-10-CM | POA: Insufficient documentation

## 2023-07-22 DIAGNOSIS — R0602 Shortness of breath: Secondary | ICD-10-CM | POA: Diagnosis not present

## 2023-07-22 LAB — ECHOCARDIOGRAM COMPLETE
Area-P 1/2: 4.1 cm2
S' Lateral: 3.3 cm

## 2023-07-22 NOTE — Telephone Encounter (Signed)
Called and left a message on vc mail.  Sent a [employer] too.

## 2023-07-22 NOTE — Telephone Encounter (Signed)
Needs spirometry due to SOB Normal echocardiogram and benign holter monitor

## 2023-07-25 DIAGNOSIS — L905 Scar conditions and fibrosis of skin: Secondary | ICD-10-CM | POA: Diagnosis not present

## 2023-07-25 DIAGNOSIS — D225 Melanocytic nevi of trunk: Secondary | ICD-10-CM | POA: Diagnosis not present

## 2023-07-25 DIAGNOSIS — D485 Neoplasm of uncertain behavior of skin: Secondary | ICD-10-CM | POA: Diagnosis not present

## 2023-10-29 DIAGNOSIS — Z09 Encounter for follow-up examination after completed treatment for conditions other than malignant neoplasm: Secondary | ICD-10-CM | POA: Diagnosis not present

## 2023-10-29 DIAGNOSIS — D2262 Melanocytic nevi of left upper limb, including shoulder: Secondary | ICD-10-CM | POA: Diagnosis not present

## 2023-10-29 DIAGNOSIS — Z872 Personal history of diseases of the skin and subcutaneous tissue: Secondary | ICD-10-CM | POA: Diagnosis not present

## 2023-10-29 DIAGNOSIS — D2261 Melanocytic nevi of right upper limb, including shoulder: Secondary | ICD-10-CM | POA: Diagnosis not present

## 2023-10-29 DIAGNOSIS — L814 Other melanin hyperpigmentation: Secondary | ICD-10-CM | POA: Diagnosis not present

## 2023-10-29 DIAGNOSIS — D225 Melanocytic nevi of trunk: Secondary | ICD-10-CM | POA: Diagnosis not present

## 2023-10-29 DIAGNOSIS — D2271 Melanocytic nevi of right lower limb, including hip: Secondary | ICD-10-CM | POA: Diagnosis not present

## 2023-10-29 DIAGNOSIS — D485 Neoplasm of uncertain behavior of skin: Secondary | ICD-10-CM | POA: Diagnosis not present

## 2023-10-29 DIAGNOSIS — D2272 Melanocytic nevi of left lower limb, including hip: Secondary | ICD-10-CM | POA: Diagnosis not present

## 2023-12-08 DIAGNOSIS — D3131 Benign neoplasm of right choroid: Secondary | ICD-10-CM | POA: Diagnosis not present

## 2024-01-26 ENCOUNTER — Encounter: Payer: Self-pay | Admitting: Cardiology

## 2024-01-27 DIAGNOSIS — Z803 Family history of malignant neoplasm of breast: Secondary | ICD-10-CM | POA: Diagnosis not present

## 2024-01-27 DIAGNOSIS — Z6823 Body mass index (BMI) 23.0-23.9, adult: Secondary | ICD-10-CM | POA: Diagnosis not present

## 2024-01-27 DIAGNOSIS — Z3040 Encounter for surveillance of contraceptives, unspecified: Secondary | ICD-10-CM | POA: Diagnosis not present

## 2024-01-27 DIAGNOSIS — Z01419 Encounter for gynecological examination (general) (routine) without abnormal findings: Secondary | ICD-10-CM | POA: Diagnosis not present

## 2024-01-27 DIAGNOSIS — Z1330 Encounter for screening examination for mental health and behavioral disorders, unspecified: Secondary | ICD-10-CM | POA: Diagnosis not present

## 2024-03-02 ENCOUNTER — Other Ambulatory Visit (HOSPITAL_COMMUNITY): Payer: Self-pay

## 2024-03-02 MED ORDER — METHYLPREDNISOLONE 4 MG PO TBPK
ORAL_TABLET | ORAL | 0 refills | Status: DC
  Filled 2024-03-02: qty 21, 6d supply, fill #0

## 2024-03-02 MED ORDER — HYDROCODONE-ACETAMINOPHEN 7.5-325 MG PO TABS
1.0000 | ORAL_TABLET | Freq: Four times a day (QID) | ORAL | 0 refills | Status: DC | PRN
  Filled 2024-03-02: qty 16, 4d supply, fill #0

## 2024-03-02 MED ORDER — CLINDAMYCIN HCL 300 MG PO CAPS
300.0000 mg | ORAL_CAPSULE | Freq: Two times a day (BID) | ORAL | 0 refills | Status: DC
  Filled 2024-03-02: qty 14, 7d supply, fill #0

## 2024-03-16 ENCOUNTER — Other Ambulatory Visit (HOSPITAL_COMMUNITY): Payer: Self-pay

## 2024-03-16 MED ORDER — CLINDAMYCIN HCL 300 MG PO CAPS
300.0000 mg | ORAL_CAPSULE | Freq: Three times a day (TID) | ORAL | 1 refills | Status: DC
  Filled 2024-03-16: qty 21, 7d supply, fill #0

## 2024-03-18 ENCOUNTER — Other Ambulatory Visit (HOSPITAL_COMMUNITY): Payer: Self-pay

## 2024-03-19 ENCOUNTER — Other Ambulatory Visit (HOSPITAL_COMMUNITY): Payer: Self-pay

## 2024-03-27 ENCOUNTER — Other Ambulatory Visit (HOSPITAL_COMMUNITY): Payer: Self-pay

## 2024-05-04 DIAGNOSIS — L814 Other melanin hyperpigmentation: Secondary | ICD-10-CM | POA: Diagnosis not present

## 2024-05-04 DIAGNOSIS — L821 Other seborrheic keratosis: Secondary | ICD-10-CM | POA: Diagnosis not present

## 2024-05-04 DIAGNOSIS — D225 Melanocytic nevi of trunk: Secondary | ICD-10-CM | POA: Diagnosis not present

## 2024-05-04 DIAGNOSIS — Z09 Encounter for follow-up examination after completed treatment for conditions other than malignant neoplasm: Secondary | ICD-10-CM | POA: Diagnosis not present

## 2024-05-04 DIAGNOSIS — Z872 Personal history of diseases of the skin and subcutaneous tissue: Secondary | ICD-10-CM | POA: Diagnosis not present

## 2024-05-20 ENCOUNTER — Other Ambulatory Visit (HOSPITAL_COMMUNITY): Payer: Self-pay

## 2024-05-21 ENCOUNTER — Other Ambulatory Visit (HOSPITAL_COMMUNITY): Payer: Self-pay

## 2024-05-24 ENCOUNTER — Other Ambulatory Visit (HOSPITAL_COMMUNITY): Payer: Self-pay

## 2024-05-24 MED ORDER — AMOXICILLIN 500 MG PO CAPS
ORAL_CAPSULE | ORAL | 1 refills | Status: AC
  Filled 2024-05-24: qty 21, 8d supply, fill #0

## 2024-08-04 DIAGNOSIS — D3131 Benign neoplasm of right choroid: Secondary | ICD-10-CM | POA: Diagnosis not present

## 2024-11-25 DIAGNOSIS — Z872 Personal history of diseases of the skin and subcutaneous tissue: Secondary | ICD-10-CM | POA: Diagnosis not present

## 2024-11-25 DIAGNOSIS — L814 Other melanin hyperpigmentation: Secondary | ICD-10-CM | POA: Diagnosis not present

## 2024-11-25 DIAGNOSIS — L821 Other seborrheic keratosis: Secondary | ICD-10-CM | POA: Diagnosis not present

## 2024-11-25 DIAGNOSIS — D1801 Hemangioma of skin and subcutaneous tissue: Secondary | ICD-10-CM | POA: Diagnosis not present

## 2024-11-25 DIAGNOSIS — Z09 Encounter for follow-up examination after completed treatment for conditions other than malignant neoplasm: Secondary | ICD-10-CM | POA: Diagnosis not present

## 2024-11-30 ENCOUNTER — Ambulatory Visit: Admitting: Nurse Practitioner

## 2025-01-07 ENCOUNTER — Encounter: Payer: Self-pay | Admitting: Nurse Practitioner

## 2025-01-07 ENCOUNTER — Ambulatory Visit: Admitting: Nurse Practitioner

## 2025-01-07 VITALS — BP 108/66 | HR 84 | Temp 98.2°F | Ht 65.0 in | Wt 178.8 lb

## 2025-01-07 DIAGNOSIS — Z6829 Body mass index (BMI) 29.0-29.9, adult: Secondary | ICD-10-CM | POA: Diagnosis not present

## 2025-01-07 DIAGNOSIS — Z136 Encounter for screening for cardiovascular disorders: Secondary | ICD-10-CM

## 2025-01-07 DIAGNOSIS — Z13220 Encounter for screening for lipoid disorders: Secondary | ICD-10-CM | POA: Diagnosis not present

## 2025-01-07 DIAGNOSIS — E663 Overweight: Secondary | ICD-10-CM | POA: Diagnosis not present

## 2025-01-07 LAB — COMPREHENSIVE METABOLIC PANEL WITH GFR
ALT: 22 U/L (ref 3–35)
AST: 23 U/L (ref 5–37)
Albumin: 4.2 g/dL (ref 3.5–5.2)
Alkaline Phosphatase: 74 U/L (ref 39–117)
BUN: 10 mg/dL (ref 6–23)
CO2: 29 meq/L (ref 19–32)
Calcium: 9.1 mg/dL (ref 8.4–10.5)
Chloride: 103 meq/L (ref 96–112)
Creatinine, Ser: 0.76 mg/dL (ref 0.40–1.20)
GFR: 102.09 mL/min
Glucose, Bld: 90 mg/dL (ref 70–99)
Potassium: 4 meq/L (ref 3.5–5.1)
Sodium: 138 meq/L (ref 135–145)
Total Bilirubin: 0.3 mg/dL (ref 0.2–1.2)
Total Protein: 7.3 g/dL (ref 6.0–8.3)

## 2025-01-07 LAB — LIPID PANEL
Cholesterol: 174 mg/dL (ref 28–200)
HDL: 32.1 mg/dL — ABNORMAL LOW
LDL Cholesterol: 88 mg/dL (ref 10–99)
NonHDL: 142.2
Total CHOL/HDL Ratio: 5
Triglycerides: 270 mg/dL — ABNORMAL HIGH (ref 10.0–149.0)
VLDL: 54 mg/dL — ABNORMAL HIGH (ref 0.0–40.0)

## 2025-01-07 LAB — CBC
HCT: 38 % (ref 36.0–46.0)
Hemoglobin: 12.8 g/dL (ref 12.0–15.0)
MCHC: 33.6 g/dL (ref 30.0–36.0)
MCV: 86.2 fl (ref 78.0–100.0)
Platelets: 307 K/uL (ref 150.0–400.0)
RBC: 4.41 Mil/uL (ref 3.87–5.11)
RDW: 13 % (ref 11.5–15.5)
WBC: 6.5 K/uL (ref 4.0–10.5)

## 2025-01-07 LAB — TSH: TSH: 1.59 u[IU]/mL (ref 0.35–5.50)

## 2025-01-07 LAB — HEMOGLOBIN A1C: Hgb A1c MFr Bld: 5.6 % (ref 4.6–6.5)

## 2025-01-07 NOTE — Patient Instructions (Signed)
 Maintain of moderate intensity ot of high intensity exercise weekly Incorporate weight training or resistant training exercise. Keep a daily food log, including serving size. Get a body composition scan. Go to lab

## 2025-01-07 NOTE — Progress Notes (Unsigned)
 "               Established Patient Visit  Patient: Tricia Wallace   DOB: 01/26/1991   34 y.o. Female  MRN: [id number] Visit Date: 01/08/2025  Subjective:    Chief Complaint  Patient presents with   Weight management     Discuss weight loss medication    HPI SOB (shortness of breath) on exertion Resolved with discontinuation of caffeine, nicotine and tobacco products  Paroxysmal supraventricular tachycardia Had eval by cardiology SVT resolved with discontinuation of caffeine and nicotine  Overweight (BMI 25.0-29.9) Noted gradual weight gain, worse after IVF treatment, pregnancy and childbirth. Lowest weight in late 20s: 130s Heaviest weight: current weight. Waist: 41.9inch Hip: 47.5inch Neck 13.9inch Wt Readings from Last 3 Encounters:  01/07/25 178 lb 12.8 oz (81.1 kg)  06/24/23 148 lb 12.8 oz (67.5 kg)  06/05/23 144 lb 3.2 oz (65.4 kg)   Exercise: cardio daily. Participated in [place] weight loss program x 5months, lost 2lbs. Diet: eats at least 2-31meals daily,  Sleep: 7-8hrs, feels rested, denies an increased stress Water: 3 x16oz bottle Soda: Dr. Nunzio parma 3-4x/week No caffeine ALCOHOL: none No vaping or nicotine since 2024. No Fhx of obesity  Check cmp, Tsh, lipid panel, and hgbA1c Advised to Maintain of moderate intensity ot of high intensity exercise weekly Incorporate weight training or resistant training exercise. Keep a daily food log, including serving size. Get a body composition scan. F/up in 2weeks  Reviewed medical, surgical, and social history today  Medications: Show/hide medication list[1] Reviewed past medical and social history.   ROS per HPI above      Objective:  BP 108/66 (BP Location: Left Arm, Patient Position: Sitting, Cuff Size: Normal)   Pulse 84   Temp 98.2 F (36.8 C) (Oral)   Ht 5' 5 (1.651 m)   Wt 178 lb 12.8 oz (81.1 kg)   LMP 12/29/2024   SpO2 98%   Breastfeeding No   BMI 29.75 kg/m       Physical Exam Vitals and nursing note reviewed.  Neck:     Thyroid : No thyroid  mass, thyromegaly or thyroid  tenderness.  Cardiovascular:     Rate and Rhythm: Normal rate and regular rhythm.     Pulses: Normal pulses.     Heart sounds: Normal heart sounds.  Pulmonary:     Effort: Pulmonary effort is normal.     Breath sounds: Normal breath sounds.  Musculoskeletal:     Cervical back: Normal range of motion and neck supple.  Neurological:     Mental Status: She is alert and oriented to person, place, and time.     Results for orders placed or performed in visit on 01/07/25  Hemoglobin A1c  Result Value Ref Range   Hgb A1c MFr Bld 5.6 4.6 - 6.5 %  Comprehensive metabolic panel with GFR  Result Value Ref Range   Sodium 138 135 - 145 mEq/L   Potassium 4.0 3.5 - 5.1 mEq/L   Chloride 103 96 - 112 mEq/L   CO2 29 19 - 32 mEq/L   Glucose, Bld 90 70 - 99 mg/dL   BUN 10 6 - 23 mg/dL   Creatinine, Ser 9.23 0.40 - 1.20 mg/dL   Total Bilirubin 0.3 0.2 - 1.2 mg/dL   Alkaline Phosphatase 74 39 - 117 U/L   AST 23 5 - 37 U/L   ALT 22 3 - 35 U/L   Total Protein 7.3 6.0 - 8.3 g/dL  Albumin 4.2 3.5 - 5.2 g/dL   GFR 897.90 >39.99 mL/min   Calcium  9.1 8.4 - 10.5 mg/dL  TSH  Result Value Ref Range   TSH 1.59 0.35 - 5.50 uIU/mL  CBC  Result Value Ref Range   WBC 6.5 4.0 - 10.5 K/uL   RBC 4.41 3.87 - 5.11 Mil/uL   Platelets 307.0 150.0 - 400.0 K/uL   Hemoglobin 12.8 12.0 - 15.0 g/dL   HCT 61.9 63.9 - 53.9 %   MCV 86.2 78.0 - 100.0 fl   MCHC 33.6 30.0 - 36.0 g/dL   RDW 86.9 88.4 - 84.4 %  Lipid panel  Result Value Ref Range   Cholesterol 174 28 - 200 mg/dL   Triglycerides 729.9 (H) 10.0 - 149.0 mg/dL   HDL 67.89 (L) >60.99 mg/dL   VLDL 45.9 (H) 0.0 - 59.9 mg/dL   LDL Cholesterol 88 10 - 99 mg/dL   Total CHOL/HDL Ratio 5    NonHDL 142.20       Assessment & Plan:    Problem List Items Addressed This Visit     Overweight (BMI 25.0-29.9) - Primary   Noted gradual weight  gain, worse after IVF treatment, pregnancy and childbirth. Lowest weight in late 20s: 130s Heaviest weight: current weight. Waist: 41.9inch Hip: 47.5inch Neck 13.9inch Wt Readings from Last 3 Encounters:  01/07/25 178 lb 12.8 oz (81.1 kg)  06/24/23 148 lb 12.8 oz (67.5 kg)  06/05/23 144 lb 3.2 oz (65.4 kg)   Exercise: cardio daily. Participated in [place] weight loss program x 5months, lost 2lbs. Diet: eats at least 2-71meals daily,  Sleep: 7-8hrs, feels rested, denies an increased stress Water: 3 x16oz bottle Soda: Dr. Nunzio parma 3-4x/week No caffeine ALCOHOL: none No vaping or nicotine since 2024. No Fhx of obesity  Check cmp, Tsh, lipid panel, and hgbA1c Advised to Maintain of moderate intensity ot of high intensity exercise weekly Incorporate weight training or resistant training exercise. Keep a daily food log, including serving size. Get a body composition scan. F/up in 2weeks      Relevant Orders   Hemoglobin A1c (Completed)   Comprehensive metabolic panel with GFR (Completed)   TSH (Completed)   CBC (Completed)   Lipid panel (Completed)   Other Visit Diagnoses       Encounter for lipid screening for cardiovascular disease       Relevant Orders   Lipid panel (Completed)      Return in about 2 weeks (around 01/21/2025) for Weight management.     Roselie Mood, NP      [1]  Outpatient Medications Prior to Visit  Medication Sig   [DISCONTINUED] clindamycin  (CLEOCIN ) 300 MG capsule Take 1 capsule (300 mg total) by mouth 3 (three) times daily.   [DISCONTINUED] HYDROcodone -acetaminophen  (NORCO) 7.5-325 MG tablet Take 1 tablet by mouth every 6 (six) hours as needed for dental pain. (Patient not taking: Reported on 01/07/2025)   [DISCONTINUED] methylPREDNISolone  (MEDROL  DOSEPAK) 4 MG TBPK tablet Take as directed (Patient not taking: Reported on 01/07/2025)   No facility-administered medications prior to visit.   "

## 2025-01-08 ENCOUNTER — Encounter: Payer: Self-pay | Admitting: Nurse Practitioner

## 2025-01-08 DIAGNOSIS — E663 Overweight: Secondary | ICD-10-CM | POA: Insufficient documentation

## 2025-01-08 NOTE — Assessment & Plan Note (Signed)
 Noted gradual weight gain, worse after IVF treatment, pregnancy and childbirth. Lowest weight in late 20s: 130s Heaviest weight: current weight. Waist: 41.9inch Hip: 47.5inch Neck 13.9inch Wt Readings from Last 3 Encounters:  01/07/25 178 lb 12.8 oz (81.1 kg)  06/24/23 148 lb 12.8 oz (67.5 kg)  06/05/23 144 lb 3.2 oz (65.4 kg)   Exercise: cardio daily. Participated in [place] weight loss program x 5months, lost 2lbs. Diet: eats at least 2-83meals daily,  Sleep: 7-8hrs, feels rested, denies an increased stress Water: 3 x16oz bottle Soda: Dr. Nunzio parma 3-4x/week No caffeine ALCOHOL: none No vaping or nicotine since 2024. No Fhx of obesity  Check cmp, Tsh, lipid panel, and hgbA1c Advised to Maintain of moderate intensity ot of high intensity exercise weekly Incorporate weight training or resistant training exercise. Keep a daily food log, including serving size. Get a body composition scan. F/up in 2weeks

## 2025-01-08 NOTE — Assessment & Plan Note (Addendum)
 Resolved with discontinuation of caffeine, nicotine and tobacco products

## 2025-01-08 NOTE — Assessment & Plan Note (Signed)
 Had eval by cardiology SVT resolved with discontinuation of caffeine and nicotine

## 2025-01-11 ENCOUNTER — Ambulatory Visit: Admitting: Nurse Practitioner

## 2025-01-12 ENCOUNTER — Other Ambulatory Visit (HOSPITAL_COMMUNITY): Payer: Self-pay

## 2025-01-12 ENCOUNTER — Ambulatory Visit: Payer: Self-pay | Admitting: Nurse Practitioner

## 2025-01-12 ENCOUNTER — Encounter: Payer: Self-pay | Admitting: Pharmacist

## 2025-01-12 DIAGNOSIS — E781 Pure hyperglyceridemia: Secondary | ICD-10-CM

## 2025-01-12 MED ORDER — FENOFIBRATE 145 MG PO TABS
145.0000 mg | ORAL_TABLET | Freq: Every day | ORAL | 1 refills | Status: AC
  Filled 2025-01-12: qty 90, 90d supply, fill #0

## 2025-01-24 ENCOUNTER — Other Ambulatory Visit (HOSPITAL_COMMUNITY): Payer: Self-pay

## 2025-01-28 ENCOUNTER — Ambulatory Visit: Admitting: Nurse Practitioner
